# Patient Record
Sex: Male | Born: 1980 | Race: Black or African American | Hispanic: No | Marital: Single | Smoking: Current every day smoker
Health system: Southern US, Community
[De-identification: ages and names within clinical notes are randomized; demographics above are authoritative.]

## PROBLEM LIST (undated history)

## (undated) DIAGNOSIS — I10 Essential (primary) hypertension: Secondary | ICD-10-CM

## (undated) DIAGNOSIS — F319 Bipolar disorder, unspecified: Secondary | ICD-10-CM

## (undated) DIAGNOSIS — F209 Schizophrenia, unspecified: Secondary | ICD-10-CM

## (undated) HISTORY — PX: COLONOSCOPY: SHX174

---
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
## 2001-11-05 ENCOUNTER — Emergency Department (HOSPITAL_COMMUNITY): Admission: EM | Admit: 2001-11-05 | Discharge: 2001-11-06 | Payer: Self-pay | Admitting: *Deleted

## 2002-10-16 ENCOUNTER — Emergency Department (HOSPITAL_COMMUNITY): Admission: EM | Admit: 2002-10-16 | Discharge: 2002-10-16 | Payer: Self-pay | Admitting: Emergency Medicine

## 2003-07-13 ENCOUNTER — Emergency Department (HOSPITAL_COMMUNITY): Admission: EM | Admit: 2003-07-13 | Discharge: 2003-07-13 | Payer: Self-pay | Admitting: Emergency Medicine

## 2003-07-22 ENCOUNTER — Ambulatory Visit (HOSPITAL_COMMUNITY): Admission: RE | Admit: 2003-07-22 | Discharge: 2003-07-22 | Payer: Self-pay | Admitting: Family Medicine

## 2005-08-18 ENCOUNTER — Emergency Department (HOSPITAL_COMMUNITY): Admission: EM | Admit: 2005-08-18 | Discharge: 2005-08-18 | Payer: Self-pay | Admitting: Family Medicine

## 2005-09-11 ENCOUNTER — Inpatient Hospital Stay (HOSPITAL_COMMUNITY): Admission: EM | Admit: 2005-09-11 | Discharge: 2005-09-15 | Payer: Self-pay | Admitting: Emergency Medicine

## 2005-09-15 ENCOUNTER — Inpatient Hospital Stay (HOSPITAL_COMMUNITY): Admission: AD | Admit: 2005-09-15 | Discharge: 2005-09-17 | Payer: Self-pay | Admitting: Psychiatry

## 2005-09-15 ENCOUNTER — Ambulatory Visit: Payer: Self-pay | Admitting: Psychiatry

## 2006-09-27 ENCOUNTER — Emergency Department: Payer: Self-pay | Admitting: Emergency Medicine

## 2007-05-01 ENCOUNTER — Emergency Department (HOSPITAL_COMMUNITY): Admission: EM | Admit: 2007-05-01 | Discharge: 2007-05-01 | Payer: Self-pay | Admitting: Emergency Medicine

## 2007-10-02 ENCOUNTER — Emergency Department (HOSPITAL_COMMUNITY): Admission: EM | Admit: 2007-10-02 | Discharge: 2007-10-02 | Payer: Self-pay | Admitting: Emergency Medicine

## 2007-10-03 ENCOUNTER — Inpatient Hospital Stay (HOSPITAL_COMMUNITY): Admission: RE | Admit: 2007-10-03 | Discharge: 2007-10-05 | Payer: Self-pay | Admitting: Psychiatry

## 2007-10-05 ENCOUNTER — Ambulatory Visit: Payer: Self-pay | Admitting: Psychiatry

## 2007-11-16 ENCOUNTER — Ambulatory Visit (HOSPITAL_COMMUNITY): Payer: Self-pay | Admitting: Psychology

## 2008-06-13 ENCOUNTER — Emergency Department (HOSPITAL_COMMUNITY): Admission: EM | Admit: 2008-06-13 | Discharge: 2008-06-13 | Payer: Self-pay | Admitting: Emergency Medicine

## 2008-06-14 ENCOUNTER — Emergency Department (HOSPITAL_COMMUNITY): Admission: EM | Admit: 2008-06-14 | Discharge: 2008-06-14 | Payer: Self-pay | Admitting: Emergency Medicine

## 2008-06-15 ENCOUNTER — Inpatient Hospital Stay (HOSPITAL_COMMUNITY): Admission: RE | Admit: 2008-06-15 | Discharge: 2008-06-20 | Payer: Self-pay | Admitting: Psychiatry

## 2008-06-15 ENCOUNTER — Ambulatory Visit: Payer: Self-pay | Admitting: Psychiatry

## 2009-01-06 ENCOUNTER — Inpatient Hospital Stay (HOSPITAL_COMMUNITY): Admission: EM | Admit: 2009-01-06 | Discharge: 2009-01-09 | Payer: Self-pay | Admitting: Psychiatry

## 2009-01-06 ENCOUNTER — Ambulatory Visit: Payer: Self-pay | Admitting: Psychiatry

## 2009-01-06 ENCOUNTER — Emergency Department (HOSPITAL_COMMUNITY): Admission: EM | Admit: 2009-01-06 | Discharge: 2009-01-06 | Payer: Self-pay | Admitting: Emergency Medicine

## 2009-08-17 ENCOUNTER — Emergency Department (HOSPITAL_COMMUNITY): Admission: EM | Admit: 2009-08-17 | Discharge: 2009-08-18 | Payer: Self-pay | Admitting: Emergency Medicine

## 2010-01-06 ENCOUNTER — Emergency Department (HOSPITAL_COMMUNITY)
Admission: EM | Admit: 2010-01-06 | Discharge: 2010-01-06 | Payer: Self-pay | Source: Home / Self Care | Admitting: Emergency Medicine

## 2010-03-26 LAB — DIFFERENTIAL
Basophils Relative: 1 % (ref 0–1)
Eosinophils Absolute: 0.1 10*3/uL (ref 0.0–0.7)
Eosinophils Relative: 1 % (ref 0–5)
Lymphs Abs: 2.6 10*3/uL (ref 0.7–4.0)
Monocytes Absolute: 0.7 10*3/uL (ref 0.1–1.0)
Monocytes Relative: 8 % (ref 3–12)

## 2010-03-26 LAB — BASIC METABOLIC PANEL
CO2: 26 mEq/L (ref 19–32)
Calcium: 8.4 mg/dL (ref 8.4–10.5)
Chloride: 108 mEq/L (ref 96–112)
GFR calc Af Amer: >60 mL/min (ref >60)
Glucose, Bld: 119 mg/dL — ABNORMAL HIGH (ref 70–99)
Potassium: 3.8 mEq/L (ref 3.5–5.1)
Sodium: 143 mEq/L (ref 135–145)

## 2010-03-26 LAB — CBC
HCT: 50.7 % (ref 39.0–52.0)
Hemoglobin: 16.9 g/dL (ref 13.0–17.0)
MCH: 27.3 pg (ref 26.0–34.0)
MCHC: 33.3 g/dL (ref 30.0–36.0)
MCV: 82 fL (ref 78.0–100.0)
RBC: 6.18 MIL/uL — ABNORMAL HIGH (ref 4.22–5.81)

## 2010-03-26 LAB — URINALYSIS, ROUTINE W REFLEX MICROSCOPIC
Bilirubin Urine: NEGATIVE
Glucose, UA: NEGATIVE mg/dL
Protein, ur: NEGATIVE mg/dL
Urobilinogen, UA: 0.2 mg/dL (ref 0.0–1.0)

## 2010-03-26 LAB — RAPID URINE DRUG SCREEN, HOSP PERFORMED
Benzodiazepines: NOT DETECTED
Cocaine: POSITIVE — AB
Opiates: POSITIVE — AB
Tetrahydrocannabinol: NOT DETECTED

## 2010-03-26 LAB — URINE MICROSCOPIC-ADD ON

## 2010-03-30 LAB — CBC
Hemoglobin: 15.8 g/dL (ref 13.0–17.0)
Platelets: 237 10*3/uL (ref 150–400)
RBC: 5.83 MIL/uL — ABNORMAL HIGH (ref 4.22–5.81)
WBC: 14.1 10*3/uL — ABNORMAL HIGH (ref 4.0–10.5)

## 2010-03-30 LAB — ETHANOL: Alcohol, Ethyl (B): <5 mg/dL (ref 0–10)

## 2010-03-30 LAB — DIFFERENTIAL
Lymphocytes Relative: 13 % (ref 12–46)
Lymphs Abs: 1.8 10*3/uL (ref 0.7–4.0)
Monocytes Relative: 6 % (ref 3–12)
Neutro Abs: 11.2 10*3/uL — ABNORMAL HIGH (ref 1.7–7.7)
Neutrophils Relative %: 80 % — ABNORMAL HIGH (ref 43–77)

## 2010-03-30 LAB — RAPID URINE DRUG SCREEN, HOSP PERFORMED
Amphetamines: NOT DETECTED
Barbiturates: NOT DETECTED
Benzodiazepines: NOT DETECTED

## 2010-03-30 LAB — BASIC METABOLIC PANEL
CO2: 26 mEq/L (ref 19–32)
Calcium: 9.8 mg/dL (ref 8.4–10.5)
Creatinine, Ser: 0.93 mg/dL (ref 0.4–1.5)
GFR calc Af Amer: >60 mL/min (ref >60)
Sodium: 140 mEq/L (ref 135–145)

## 2010-04-16 LAB — HEPATIC FUNCTION PANEL
ALT: 17 U/L (ref 0–53)
AST: 20 U/L (ref 0–37)
Albumin: 3.7 g/dL (ref 3.5–5.2)
Alkaline Phosphatase: 73 U/L (ref 39–117)
Total Bilirubin: 0.8 mg/dL (ref 0.3–1.2)
Total Protein: 6.4 g/dL (ref 6.0–8.3)

## 2010-04-16 LAB — BASIC METABOLIC PANEL
Calcium: 9.2 mg/dL (ref 8.4–10.5)
GFR calc non Af Amer: >60 mL/min (ref >60)
Glucose, Bld: 129 mg/dL — ABNORMAL HIGH (ref 70–99)
Sodium: 141 mEq/L (ref 135–145)

## 2010-04-16 LAB — RAPID URINE DRUG SCREEN, HOSP PERFORMED
Amphetamines: NOT DETECTED
Opiates: NOT DETECTED
Tetrahydrocannabinol: POSITIVE — AB

## 2010-04-16 LAB — LIPID PANEL
Cholesterol: 175 mg/dL (ref 0–200)
HDL: 51 mg/dL (ref >39)
LDL Cholesterol: 108 mg/dL — ABNORMAL HIGH (ref 0–99)
Triglycerides: 79 mg/dL (ref <150)

## 2010-04-16 LAB — ETHANOL: Alcohol, Ethyl (B): <5 mg/dL (ref 0–10)

## 2010-04-16 LAB — HEMOGLOBIN A1C: Mean Plasma Glucose: 120 mg/dL

## 2010-04-16 LAB — CBC
Hemoglobin: 15.2 g/dL (ref 13.0–17.0)
Platelets: 278 10*3/uL (ref 150–400)
RDW: 14.9 % (ref 11.5–15.5)
WBC: 12 10*3/uL — ABNORMAL HIGH (ref 4.0–10.5)

## 2010-04-16 LAB — RPR: RPR Ser Ql: NONREACTIVE

## 2010-04-23 LAB — POCT I-STAT, CHEM 8
BUN: 9 mg/dL (ref 6–23)
Creatinine, Ser: 1.2 mg/dL (ref 0.4–1.5)
Glucose, Bld: 104 mg/dL — ABNORMAL HIGH (ref 70–99)
Sodium: 142 mEq/L (ref 135–145)
TCO2: 26 mmol/L (ref 0–100)

## 2010-04-23 LAB — COMPREHENSIVE METABOLIC PANEL
ALT: 13 U/L (ref 0–53)
AST: 22 U/L (ref 0–37)
Albumin: 4.4 g/dL (ref 3.5–5.2)
Alkaline Phosphatase: 87 U/L (ref 39–117)
Chloride: 107 mEq/L (ref 96–112)
GFR calc Af Amer: >60 mL/min (ref >60)
Potassium: 3.3 mEq/L — ABNORMAL LOW (ref 3.5–5.1)
Sodium: 143 mEq/L (ref 135–145)
Total Protein: 7.4 g/dL (ref 6.0–8.3)

## 2010-04-23 LAB — VITAMIN B12: Vitamin B-12: 634 pg/mL (ref 211–911)

## 2010-04-23 LAB — URINALYSIS, ROUTINE W REFLEX MICROSCOPIC
Hgb urine dipstick: NEGATIVE
Ketones, ur: 15 mg/dL — AB
Nitrite: NEGATIVE
Nitrite: NEGATIVE
Protein, ur: 30 mg/dL — AB
Specific Gravity, Urine: 1.023 (ref 1.005–1.030)
Urobilinogen, UA: 1 mg/dL (ref 0.0–1.0)
Urobilinogen, UA: 2 mg/dL — ABNORMAL HIGH (ref 0.0–1.0)

## 2010-04-23 LAB — DIFFERENTIAL
Basophils Relative: 0 % (ref 0–1)
Eosinophils Absolute: 0.2 10*3/uL (ref 0.0–0.7)
Eosinophils Relative: 2 % (ref 0–5)
Monocytes Absolute: 1.1 10*3/uL — ABNORMAL HIGH (ref 0.1–1.0)
Monocytes Relative: 10 % (ref 3–12)
Neutro Abs: 7.1 10*3/uL (ref 1.7–7.7)

## 2010-04-23 LAB — CBC
Platelets: 239 10*3/uL (ref 150–400)
RDW: 15 % (ref 11.5–15.5)
WBC: 10.8 10*3/uL — ABNORMAL HIGH (ref 4.0–10.5)

## 2010-04-23 LAB — ACETAMINOPHEN LEVEL: Acetaminophen (Tylenol), Serum: <10 ug/mL — ABNORMAL LOW (ref 10–30)

## 2010-04-23 LAB — RAPID URINE DRUG SCREEN, HOSP PERFORMED
Amphetamines: NOT DETECTED
Benzodiazepines: NOT DETECTED

## 2010-04-23 LAB — URINE MICROSCOPIC-ADD ON

## 2010-04-23 LAB — ETHANOL: Alcohol, Ethyl (B): <5 mg/dL (ref 0–10)

## 2010-04-24 LAB — URINALYSIS, ROUTINE W REFLEX MICROSCOPIC
Bilirubin Urine: NEGATIVE
Glucose, UA: NEGATIVE mg/dL
Protein, ur: NEGATIVE mg/dL

## 2010-04-24 LAB — URINE MICROSCOPIC-ADD ON

## 2010-04-24 LAB — POCT I-STAT, CHEM 8
BUN: 5 mg/dL — ABNORMAL LOW (ref 6–23)
Calcium, Ion: 1.14 mmol/L (ref 1.12–1.32)
Chloride: 104 mEq/L (ref 96–112)
Glucose, Bld: 130 mg/dL — ABNORMAL HIGH (ref 70–99)

## 2010-04-24 LAB — RAPID URINE DRUG SCREEN, HOSP PERFORMED
Barbiturates: NOT DETECTED
Opiates: NOT DETECTED

## 2010-05-29 NOTE — H&P (Signed)
[name], [name]              ACCOUNT NO.:  [id number]   MEDICAL RECORD NO.:  [id number]          PATIENT TYPE:  INP   LOCATION:  0304                          FACILITY:  [facility]   PHYSICIAN:  Anselm Jungling, MD  DATE OF BIRTH:  1980-03-22   DATE OF ADMISSION:  10/03/2007  DATE OF DISCHARGE:                       PSYCHIATRIC ADMISSION ASSESSMENT   This is an involuntary admission to the services of Dr. Geralyn Flash.   IDENTIFYING INFORMATION:  This is a 30 year old single [place] American  male.  Apparently, he became noncompliant with his meds approximately  two months ago.  He presented to the emergency department.  He had not  slept in the past two days.  His mood was unstable.  He had a panic  attack at his work.  In the ED, he exhibited poor impulse control by  pulling out his IV.  He was agitated.  He was felt to represent a danger  to himself and/or others, hence, he was comitted.  Today, he reports  that he got tired of having to take meds.  He initially started weaning  himself off the meds and eventually stopped.  He had been seeing Dr. Damita Dunnings.  He states that he missed one appointment and has not been able  to get back into care.   PAST PSYCHIATRIC HISTORY:  [name] had his first episode at age 76,  and he was with [place] in 2007.  He was with [place] from September 2-4, 2007.   SOCIAL HISTORY:  Basically the same.  He is a high school graduate in  2002.  He has never married.  He has no children.  He is still employed  at [employer].  He began his employment with them in February,  2006.  He has not started college.  When he was last with [place], he was  hoping to have started college.   FAMILY HISTORY:  He has a maternal aunt who was thought to have a mood  disorder and a maternal uncle with schizophrenia.   ALCOHOL/DRUG HISTORY:  He has been using marijuana and alcohol since age  60 as well as occasional Vicodin and Xanax.  His UDS was negative, and  he had  no alcohol on board yesterday.   PRIMARY CARE Leighana Neyman:  None.   He was seeing Dr. Damita Dunnings for psychiatry followup, but I do not  believe he is still a patient.   MEDICAL PROBLEMS:  He is noted to have hypertension.   MEDICATIONS:  He stopped his medications about 2-1/2 months ago, but he  was last prescribed Abilify 2.5 mg b.i.d. and Depakote ER 1000 mg  nightly, and these meds were restarted.   DRUG ALLERGIES:  He has no known drug allergies.   POSITIVE PHYSICAL FINDINGS:  He was medically cleared in the ED at the  hospital.  As already stated, his UDS was negative.  His alcohol level  was less than 5.  His vital signs on admission to our unit show he is 69 inches tall.  Weight is 145.  Temperature was 97.3.  Blood pressure was  136/91 to  134/89.  Pulse ranged 84-110.  Respirations are 16.   He does not have any positives on his review of systems today.   MENTAL STATUS EXAM:  He is alert but drowsy.  He is appropriately  groomed, dressed, and nourished.  His speech is not pressured.  His mood  is calm.  He reports that he feels kind of woozy from having had his  meds restarted.  His thought processes are clear, rational, and goal-  oriented.  He understands now that he probably needs to remain on at  least one agent once he gets restabilized.  Judgment and insight are  good.  Concentration and memory superficially are intact.  Intelligence  is at least average.  He denies being suicidal or suicidal.  He denies  auditory or visual hallucinations.   DIAGNOSES:   AXIS I:  Bipolar.  Most recent episode manic due to noncompliance.   AXIS II:  Deferred.   AXIS III:  Hypertension.   AXIS IV:  No serious stressors at the moment.   AXIS V:  35.   PLAN:  Admit for safety and stabilization. We will restart his meds.  We  will identify a psychiatrist who was in his network, [facility].   ESTIMATED LENGTH OF STAY:  3-5 days.  We will check his Depakote level  on  September 21.      Mickie Leonarda Salon, P.A.-C.      Anselm Jungling, MD  Electronically Signed    MD/MEDQ  D:  10/03/2007  T:  10/03/2007  Job:  [id number]

## 2010-05-29 NOTE — H&P (Signed)
[name], [name]              ACCOUNT NO.:  [id number]   MEDICAL RECORD NO.:  [id number]          PATIENT TYPE:  IPS   LOCATION:  0502                          FACILITY:  [facility]   PHYSICIAN:  Landry Corporal, N.P.    DATE OF BIRTH:  Feb 18, 1980   DATE OF ADMISSION:  06/15/2008  DATE OF DISCHARGE:                       PSYCHIATRIC ADMISSION ASSESSMENT   PATIENT IDENTIFICATION:  This is a 30 year old male who was voluntarily  admitted on June 14, 2008.   HISTORY OF PRESENT ILLNESS:  The patient initially presented to the  emergency room with complaints of heat exhaustion and being off his  medications for some time.  Presented with his mother who also noted  some behavioral changes with no further details.  The patient today  offers no significant stressors in his life.   PAST PSYCHIATRIC HISTORY:  The patient has been here prior, that was in  September 2009 for bipolar disorder and panic attacks was discharged on  Abilify and stab sore.   SOCIAL HISTORY:  This is a single male who lives in [place].  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   FAMILY HISTORY:  None known.   ALCOHOL AND DRUG HABITS:  The patient uses beer on occasion.  Urine drug  screen is positive for cannabis.   PRIMARY CARE Tevin Shillingford:  None.   MEDICAL PROBLEMS:  He denies any acute or chronic health issues.   MEDICATIONS:  Listed is Benadryl, and the patient had taken some Symbyax  at home.   DRUG ALLERGIES:  No known allergies.   PHYSICAL EXAMINATION:  This is a well-nourished, well-developed young  male who was fully assessed at [facility] emergency department.  Physical exam was reviewed with no significant findings.  His vital  signs 98.7, 74 heart rate, 16 respirations, blood pressure is 155/83.  His laboratory data shows a hemoglobin of 18 with hematocrit of 53,  blood sugar 104, Depakote level was less than 10.  Alcohol level less  than 5, lithium  level 0.25.  CBC shows WBC count 10.8.  Urine drug  screen positive for THC.  Urinalysis shows 3-6 WBCs and 3-6 RBCs.   MENTAL STATUS EXAM:  The patient is sitting on his bed eating lunch at  this time, fully alert, good eye contact.  He seems somewhat reserved.  His speech although clear, seems to have some mild thought blocking.  He  offers little information. His mood is neutral, talks briefly about the  heat exhaustion but offers no other significant details or  circumstances.  He does deny any suicidal or homicidal thoughts or  hallucinations, cognitive function intact.  He is a poor historian.   DIAGNOSES:  AXIS I:  Mood disorder.  THC abuse.  AXIS II:  Deferred.  AXIS III:  No known medical conditions.  AXIS IV:  Deferred at this time.  AXIS V:  Current is 35.   PLAN:  Plan is to gather more history.  Get permission to contact mom  for further background.  Will offer Zyprexa 10 mg at bedtime.  Consider  family session with his mother.  The case manager will obtain his follow-  up, reinforce medication compliance and address his cannabis abuse.  Tentative length of stay at this time is 3-5 days.      Landry Corporal, N.P.     JO/MEDQ  D:  06/15/2008  T:  06/15/2008  Job:  [id number]

## 2010-06-01 NOTE — Group Therapy Note (Signed)
[name], [name]              ACCOUNT NO.:  [id number]   MEDICAL RECORD NO.:  [id number]          PATIENT TYPE:  INP   LOCATION:  IC04                          [facility]   PHYSICIAN:  Margaretmary Dys, M.D.DATE OF BIRTH:  24-Oct-1980   DATE OF PROCEDURE:  09/13/2005  DATE OF DISCHARGE:                                   PROGRESS NOTE   SUBJECTIVE:  The patient remains intubated.  He is on propofol.  He is not  quite responsive obviously for this reason.  Apparently, the patient may  have overdosed on his Depakote as his levels were in the toxic range.   OBJECTIVE:  GENERAL:  The patient is sedated and remains on mechanical  ventilation.  VITAL SIGNS:  Blood pressure 120/70, pulse 82, respirations 16, T-max 98.8  degrees Fahrenheit.  HEENT:  Normocephalic.  The patient has an endotracheal tube in place.  NECK:  Supple.  LUNGS:  Clear with good air entry bilaterally.  HEART:  S1, S2 regular.  ABDOMEN:  Soft, nontender, bowel sounds positive.  EXTREMITIES:  No edema.  NEUROLOGIC:  The patient is very sedated.   LABORATORY DATA AND X-RAY FINDINGS:  Blood gas on mechanical ventilation was  pH 7.511, pCO2 33.7, pO2 167, bicarb 26.8.  WBC 6.7, hemoglobin 13,  hematocrit 39.4, platelet count 188, with no left shift.  Sodium 142,  potassium 3.2, chloride 107, CO2 27, glucose 86, BUN 5, creatinine 0.9.  AST  30, ALT 22.  Valproic acid level is down to 168 today.   ASSESSMENT:  Drug overdose with likely Depakote.  Drug levels in the toxic  range and getting better.   PLAN:  Will proceed towards trying to wean him off the ventilator.  I will  change his ventilation mode to SIMV.  Will proceed with protocol per  respiratory therapy.  Will also do an awakening trial on him this morning.  Continue to hold all his other medications at this time.      Margaretmary Dys, M.D.  Electronically Signed     AM/MEDQ  D:  09/13/2005  T:  09/13/2005  Job:  [id number]

## 2010-06-01 NOTE — Discharge Summary (Signed)
[name], [name]              ACCOUNT NO.:  [id number]   MEDICAL RECORD NO.:  [id number]          PATIENT TYPE:  IPS   LOCATION:  0502                          FACILITY:  [facility]   PHYSICIAN:  Geoffery Lyons, M.D.      DATE OF BIRTH:  Feb 05, 1980   DATE OF ADMISSION:  06/15/2008  DATE OF DISCHARGE:  06/20/2008                               DISCHARGE SUMMARY   CHIEF COMPLAINT AND PRESENT ILLNESS:  This was second admission to [facility] for this 30 year old male who was voluntarily  admitted.  He presented to the emergency room with complaints of heat  exhaustion and being off his medications for some time.  Presented with  his mother who also noted under some behavioral changes but no further  details.  On assessment, initially he presented with some  suspiciousness, afraid to answer questions.  Endorsed that he had been  diagnosed with bipolar, has been off his medication for 3 months.  Does  not feel himself.  Wants to get back on his medications.  Poor appetite,  sleeping only a few hours.   PAST PSYCHIATRIC HISTORY:  Last admission September 2009, diagnosed  bipolar and had panic attacks.  He was discharged on Abilify.   ALCOHOL AND DRUG HISTORY:  UDS positive for marijuana, uses  occasionally.   MEDICAL HISTORY:  Noncontributory.   MEDICATIONS:  Endorsed he had discontinued his medication, had taken  some Symbyax in the past as well as Abilify.   PHYSICAL EXAMINATION:  Failed to show any acute findings.   LABORATORY WORK:  Hemoglobin 18, hematocrit 53.  Alcohol level less than  5.  CBC:  White blood cells 10.8.  UDS positive for marijuana.   MENTAL STATUS EXAMINATION:  Reveals a male initially sitting in his bed,  alert, good eye contact, somewhat reserved and guarded, some thought  blocking, offers little information.  Mood is down, talks about heat  exhaustion.  Denied any active suicidal or homicidal ideations.  Cognition well-preserved.   ADMISSION  DIAGNOSES:  AXIS I:  Bipolar disorder.  Marijuana abuse.  AXIS II:  No diagnosis.  AXIS III:  No diagnosis.  AXIS IV:  Moderate.  AXIS V:  On admission 35, highest GAF in last year 50-55.   COURSE IN THE HOSPITAL:  Was admitted, started in individual and group  psychotherapy.  Was initially placed on Depakote and Symbyax.  Later, he  was placed on Zyprexa.  As already stated, diagnosed with bipolar  disorder.  Was on Zyprexa, Symbyax.  Ran out of  Zyprexa and started  taking something else.  Had been on Lamictal and Depakote.  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  Some issues about  people around him.  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  On June  3, he would rather not use the Depakote due to the gastrointestinal  symptoms.  Would rather just do the Zyprexa but very limited insight.  He was going to eventually go back home.  Living with his mother for 8  years, but he  continued to be quite reserved, guarded, not engaging,  rather constricted affect.  June 4, he was sedated that morning but did  sleep better, comfortable with the Zyprexa, and it has worked for him  before.  He had had a little broader affect.  In the next 48 hours, he  continued to improve.  Mood feeling better.  Affect was brighter, more  interactive.  On June 7, he was in full contact with reality.  No  suicidal or homicidal ideas.  Willing and motivated to continue  treatment.  Marked improvement from admission.  Was going to take some  days off before going back to work.   DISCHARGE DIAGNOSES:  AXIS I:  Bipolar disorder.  AXIS II:  No diagnosis.  AXIS III:  No diagnosis.  AXIS IV:  Moderate.  AXIS V:  On discharge 55-60.   DISCHARGED ON:  Zyprexa 5 mg 3 at bedtime.   FOLLOW-UP:  [facility], [place].      Geoffery Lyons, M.D.  Electronically Signed     IL/MEDQ  D:  07/21/2008  T:  07/21/2008  Job:  [id number]

## 2010-06-01 NOTE — H&P (Signed)
[name], [name]              ACCOUNT NO.:  [id number]   MEDICAL RECORD NO.:  [id number]          PATIENT TYPE:  IPS   LOCATION:  0501                          [facility]ACILITY[facility]  [facility]   PHYSICIAN:  Anselm Jungling, MD  DATE OF BIRTH:  [birth year]   DATE OF ADMISSION:  09/15/2005  DATE OF DISCHARGE:                         PSYCHIATRIC ADMISSION ASSESSMENT   This is an involuntary admission to the services of Dr. Lorna Dibble.   Apparently on August 29th, the patient overdosed on some medications that he  had at home.  Earlier in the week, he said that he had called [facility] to  get his medications renewed.  When he returned from visiting an aunt in  [place] on Thursday, the medications had not been renewed.  This made him  have to stay out of work on Thursday and Friday of that week, and he had to  get another appointment to see Dr. Omelia Blackwater, and apparently Dr. Omelia Blackwater wrote  him some prescriptions and also gave him some samples.  This was two days  prior to the 29th, so that would have been on the 27th.  Apparently, he  claims that he had also concomitantly, about three weeks ago now, suddenly  stopped using marijuana and alcohol.  He asked his mother and brother to get  him something to eat, and after they left, he overdosed.  They returned.  They realized that he had an altered mental status.  They brought him to the  emergency room at [facility] on the 29th, and he became more  obtunded in the emergency room, and they intubated.  He was extubated on the  31st, and he was found to be medically stable and has now been transferred  here to the [facility].  The patient was originally diagnosed  in 2003 at [facility] in [place] with bipolar disorder.  Apparently at  this time, he was charged with trespassing, using marijuana, and drinking  alcohol under the age of 2.  He was so worried about these charges that  this caused him to become very nervous.  He lost  sleep over it, etc., and  this precipitated some event, I am not exactly sure what, that had him be  admitted to [facility].  That is where he got the diagnosis of bipolar.  He has been being seen on an outpatient basis by Dr. Damita Dunnings in  [place].  He also states that he saw a therapist, Alden Server, in the past,  and liked Alden Server, but Alden Server is no longer practicing in [place].   SOCIAL HISTORY:  He is a high Garment/textile technologist in 2002.  He has never  married.  He has no children.  He has been employed at [employer] for  about 18 months now, February, 2006, and he drives a Chief Executive Officer.  He is hoping  to start college.   FAMILY HISTORY:  A maternal aunt is thought to have had a mood disorder.  A  maternal uncle with schizophrenia.   ALCOHOL/DRUG HISTORY:  He has been using marijuana and alcohol since age 40  as well as occasional Vicodin and Xanax.   PRIMARY CARE Adyson Vanburen:  He is trying to get a new one.   MEDICAL PROBLEMS:  He is supposed to be having his blood pressure evaluated.  Apparently, it has been elevated in the past.   MEDICATIONS:  He does not exactly remember his new medications, but he has  taken Symbyax in the past, 12/25.  I will have to call [facility] in  [place] to verify his meds.   DRUG ALLERGIES:  No known drug allergies.   PHYSICAL EXAMINATION:  VITAL SIGNS:  His vital signs on transfer show that  he is 67-1/2 inches tall.  He weighs 200 pounds.  Temperature 98.6, blood  pressure 182/119 to 186/76, pulse ranges from 102 to 140, respirations 18.  GENERAL:  He is a well-developed, healthy young man who appears his stated  age.  He has no other remarkable findings.  MENTAL STATUS:  Today, he is alert and oriented x3.  He is appropriately  groomed, dressed, and nourished.  His speech is not pressured.  He has  normal rate, rhythm, and tone.  His mood is good.  His affect is within  normal range.  His thought processes are clear, goal oriented.  He wants  to  go to school.  Judgment and insight are fair.  Concentration and memory are  intact.  Intelligence is at least average.  He denies suicidal or homicidal  ideation.  He denies auditory or visual hallucinations.  He states that  since being clean and sober these past weeks, he feels calmer, and his  thoughts are clearer.  He states that now he wants to read.   DIAGNOSES:   AXIS I:  1. Bipolar, status post significant overdose.  2. Substance abuse of alcohol and marijuana, early sustained remission of      three weeks.   AXIS II:  Deferred.   AXIS III:  Hypertension.   AXIS IV:  Moderate.   AXIS V:  39.   The plan is to admit for safety and stabilization.  We will adjust his  medications in the morning.  I will let Dr. Electa Sniff do that.  He might need  some testing.  I would suggest going to [facility] Department of Psychology.  He  would like to go to school, etc., but he is not sure what would be helpful  to him.      Mickie Leonarda Salon, P.A.-C.      Anselm Jungling, MD  Electronically Signed    MD/MEDQ  D:  09/16/2005  T:  09/16/2005  Job:  [phone]

## 2010-06-01 NOTE — Discharge Summary (Signed)
[name], [name]              ACCOUNT NO.:  [id number]   MEDICAL RECORD NO.:  [id number]           PATIENT TYPE:   LOCATION:  A201                          [facility]   PHYSICIAN:  Margaretmary Dys, M.D.DATE OF BIRTH:  02/25/1980   DATE OF ADMISSION:  09/11/2005  DATE OF DISCHARGE:  09/02/2007LH                                 DISCHARGE SUMMARY   TRANSFER SUMMARY   DISCHARGE DIAGNOSES:  1. Suicide attempt with drug overdose.  The patient took several pills of      Depakote.  2. Severe depression.  3. History of seizure disorders.   The patient is a 30 year old, African-American male with a history of  bipolar disorder, who stopped taking some of his medications recently.  The  patient apparently was in to see his psychiatrist two days prior to  admission and received samples and prescriptions.  He was brought into the  emergency room with altered mental status.  Family review, history and  physical from September 11, 2005.  In the emergency room, the patient was noted  to become even more obtunded, and it was decided to intubate him for airway  protection.  Apparently the patient took some medications in an attempt to  kill himself.  The patient took an unspecified amount of Depakote as well as  Strattera and Symbyax.  The patient was quite obtunded on physical exam and  was intubated at the time he was admitted to ICU.  His laboratory data  really showed a Depakote level breaking at 300, which is in the severely  toxic range.  His EKG showed sinus tachycardia with a prolonged QT interval.   The patient, however, did fairly well in the intensive care unit with no  major concerns.  He was agitated when he had his initial awakening trial,  and he had to be controlled with Propofol.  The patient, however, did fairly  well overall and was extubated successfully and his Depakote level steadily  decreased and it is currently 90 today, which is in the therapeutic range.  His  EKG's QT interval also narrowed and became normal today.  The patient is  now being transferred to a psychiatric hospital for further evaluation and  management.      Margaretmary Dys, M.D.  Electronically Signed     AM/MEDQ  D:  09/15/2005  T:  09/15/2005  Job:  [id number]

## 2010-06-01 NOTE — H&P (Signed)
NAMEKINLEY, FERRENTINO              ACCOUNT NO.:  [id number]   MEDICAL RECORD NO.:  [id number]          PATIENT TYPE:  INP   LOCATION:  IC04                          FACILITY:  [facility]   PHYSICIAN:  Lonia Blood, M.D.       DATE OF BIRTH:  30-Jun-1980   DATE OF ADMISSION:  09/11/2005  DATE OF DISCHARGE:  LH                                HISTORY & PHYSICAL   PRIMARY CARE PHYSICIAN:  He is unassigned medically.  He sees a  psychiatrist.   CHIEF COMPLAINT:  Altered mental status.   HISTORY OF PRESENT ILLNESS:  Mr. Breck is a 30 year old African-American  gentleman with a history of bipolar disorder who stopped taking his  medications recently. He then went to see his psychiatrist 2 days prior to  admission and received samples and prescriptions. He was brought in by his  family today for altered mental status.  Apparently, he took more of his  medications than prescribed in an attempt to kill himself.  After arrival to  the emergency room, he became more obtunded and the patient emergency room  physician decided to intubate for airway protection.  I was called to admit  this patient who is currently on the ventilator.   History that I could gather is that the patient has been more agitated for  the past two to three days, writing and talking constantly, unable to sleep  at night.  Today, apparently he took an unspecified amount of Depakote as  well as some Strattera and Symbyax.  The rest of the history is  unobtainable.   PAST MEDICAL HISTORY:  Bipolar disorder.   HOME MEDICATIONS:  1. Depakote 1500 mg daily.  2. Symbyax daily.  3. Strattera 1 tablet daily.   SOCIAL HISTORY:  The patient smokes a pack of cigarettes a day.  He  occasionally drinks alcohol but he is not a heavy alcohol abuser.  He lives  with his mother.   FAMILY HISTORY:  Unobtainable.   REVIEW OF SYSTEMS:  Unobtainable.   PHYSICAL EXAMINATION:  VITAL SIGNS:  Blood pressure 142/93, pulse 113,  respirations  18, saturation 100% on the ventilator.  GENERAL APPEARANCE:  A well-developed, well-built African-American gentleman  paralyzed on the ventilator, unresponsive.  HEENT:  Head is normocephalic and atraumatic.  Eyes have the pupils in the  midline.  There are about 2 mm, symmetric and reactive to light.  There is  no spontaneous extraocular movement.  NECK:  Supple.  No JVD.  CHEST:  Clear to auscultation bilaterally.  HEART:  Regular rate and rhythm without murmurs, gallops, or rubs.  ABDOMEN:  The patient's abdomen is soft.  Bowel sounds are present.  EXTREMITIES:  Lower extremities have no edema.  SKIN:  Warm and dry without any suspicious rashes.   LABORATORY DATA:  White blood cell count 7.1, hemoglobin 15, platelet count  225,000.  Sodium 139, potassium 3.2, chloride 100, bicarb 25, BUN 10,  creatinine 1.2, calcium 8.8, albumin 4.1, AST 49, ALT 33, alkaline phosphate  91.  Total bilirubin 0.8.  Alcohol level is less than 5.  Acetaminophen  level is less than 10.  A salicylate level is less than 4.  A Depakote level  is more than 300.  EKG shows sinus tachycardia and prolonged QT interval.  A  portable chest x-ray shows the endotracheal tube 6 cm above the carina and  no acute findings.   ASSESSMENT/PLAN:  1. Intentional overdose with suicidal attempt with altered mental status      secondary to ingestion of Depakote and Symbyax:  The patient was      intubated for airway protection in the emergency room.  My plan is to      admit the patient to the intensive care unit, monitor him overnight,      keep him lightly sedated and then attempt to wake him up in the      morning.  I will also  place the patient on intravenous vitamins.  Once      the patient wakes up in the morning, he should be extubated and a      psychiatry consultation should be obtained for transfer to inpatient      psychiatric hospital.  2. Mildly prolonged QT interval most likely secondary to the use of       Strattera and Symbyax:  We will avoid using any QT prolonging      medications and monitor the patient on telemetry.  3. Mild hypokalemia:  We will replace the patient's potassium aggressively      and monitor the patient on telemetry.  4. Deep vein thrombosis prophylaxis will be done using Lovenox.      Gastrointestinal prophylaxis will be done using Protonix.      Lonia Blood, M.D.  Electronically Signed     SL/MEDQ  D:  09/11/2005  T:  09/11/2005  Job:  [id number]

## 2010-06-01 NOTE — Discharge Summary (Signed)
[name], [name]              ACCOUNT NO.:  [id number]   MEDICAL RECORD NO.:  [id number]          PATIENT TYPE:  IPS   LOCATION:  0304                          [facility]   PHYSICIAN:  Geoffery Lyons, M.D.      DATE OF BIRTH:  [birth year]   DATE OF ADMISSION:  10/03/2007  DATE OF DISCHARGE:  10/05/2007                               DISCHARGE SUMMARY   CHIEF COMPLAINT:  This was the second admission to [facility] for this 30 year old African-American male male.  Apparently he  became noncompliant with his medications two months prior to his  admission and presented to the ED.  Had not slept for the last 2 days.  He was pretty unstable and had a panic attack at work.  In the ED he  exhibited poor impulse control by pulling out his IV. He was agitated.  He was felt to represent a danger to himself. Upon this assessment he  endorsed that he got tired of having to take medications, initially  started weaning himself off the medicine and eventually stopped.  Had  been seeing Dr. Redmond Baseman. He  missed one appointment and has not been able  to get back into care.   PAST SOCIAL HISTORY:  First episode at age 36, and he was at [facility] in 2007.  History of using marijuana and alcohol since  age 40 as well as occasional Vicodin and Xanax.   PAST MEDICAL HISTORY:  Hypertension.   MEDICATIONS:  He stopped his meds 2-1/2 months ago.  He was on Abilify  2.5 twice a day and Depakote ER 1000 at night.   Exam failed to show any acute findings.   Laboratory workup not available in the chart.   MENTAL STATUS EXAM:  An alert cooperative male, somewhat drowsy,  appropriately groomed and dressed.  He was not spontaneous, mostly  reserved and guarded. Endorsed he felt kind of woozy from having his  medication restarted. His thought processes were clear, rational and  goal oriented, although again not spontaneous or forthcoming with  information. Endorsed  that now he understood he probably needs to stay  on medications. He was denying any active suicidal or homicidal  ideations.  He has no cognitive deficits.   IMPRESSION:  Axis:  I Bipolar disorder.  Axis II: No diagnosis.  Axis III:  Hypertension.  Axis IV: Moderate.  Axis V:  On admission 35, highest GAF in the last year 60.   COURSE IN THE HOSPITAL:  He was admitted, and he was placed back on  Abilify and Depakote.  He was given Ambien for sleep. As already stated  he endorsed that his anxiety has been building up, had a panic attack  with increased heart rate, left work. Admitted that he had been off  medications for that period of time. Past history of some substance  abuse.  September 20 he was refusing his medications, claiming did not  want to be a vegetable. He was denying active suicidal or homicidal  ideations. On September 21 he was in full contact with  reality.  He did  admit that he had some issues at work but endorsed that there was a lot  of stress that was self-imposed.  He would like to have his production  increased, but again he was not coming from administration. He felt that  he should do it.  He was not suicidal.  He was not homicidal.  He did  say he was comfortable with the Abilify and was willing to take this  medication and he was wanting to be on Stavzor, a form of valproic acid  that he felt did not affect his stomach, and he was comfortable taking  that medication. As he was in full contact reality with no active  suicidal or homicidal ideations, willing to pursue treatment, we went  and discharged to outpatient follow-up.   DISCHARGE DIAGNOSES:  Axis I:  Bipolar disorder, panic attacks.  Axis II:  No diagnosis.  Axis III:  Hypertension.  Axis IV:  Moderate.  Axis V:  On discharge 50.   Discharged on Abilify 5 mg 1 in the morning and 1 at night. To resume  his Stavzor  as per Dr. Redmond Baseman. Ambien 10 at bedtime for sleep.  Follow  up with [facility]. Redmond Baseman in  [facility].      Geoffery Lyons, M.D.  Electronically Signed     IL/MEDQ  D:  10/20/2007  T:  10/21/2007  Job:  [id number]

## 2010-06-01 NOTE — Discharge Summary (Signed)
[name], [name]              ACCOUNT NO.:  [id number]   MEDICAL RECORD NO.:  [id number]          PATIENT TYPE:  IPS   LOCATION:  0501                          [facility]ACILITY[facility]  [facility]   PHYSICIAN:  Anselm Jungling, MD  DATE OF BIRTH:  [birth year]   DATE OF ADMISSION:  09/15/2005  DATE OF DISCHARGE:  09/17/2005                                 DISCHARGE SUMMARY   IDENTIFYING DATA AND REASON FOR ADMISSION:  The patient is a 30 year old  single African American male, working as a Estate agent, who requested  discharge after an overdose of Depakote and Symbyax which had been  prescribed for him.  He requested help with alcohol and marijuana  dependence.  He had stopped using both these substances 2-3 weeks prior to  admission.  He reported he had previously gone through detoxification for  substance abuse in 2003.  At the time of his admission he regretted his  overdose, and glad that he survived, but he was also glad that it got him  into a treatment situation in our program.  He denied any further suicidal  ideation.  He was given initial Axis I diagnoses of mood disorder NOS, and  alcohol and marijuana dependence.   MEDICAL AND LABORATORY:  The patient was medically and physically assessed  by the psychiatric nurse practitioner.  He was in good health without any  active or chronic medical problems, except for GERD, which was addressed  with Protonix 40 mg daily.   HOSPITAL COURSE:  The patient was admitted to the adult inpatient  psychiatric service.  He presented as a well-nourished, well-developed, well-  groomed and organized male.  He was fully oriented, pleasant, and  appropriate.  His mood appeared neutral.  His thoughts and speech were  normally organized and there were no signs or symptoms of psychosis.  He was  non tremulous.  He did not appear to be exhibiting any physical signs or  symptoms of alcohol or drug withdrawal.   The patient was not continued on his usual  Depakote and fluoxetine  initially.  This was because he had overdosed on these and there was concern  about excessive levels of these in his system already.   The patient engaged in discussions about his alcohol and marijuana use.  He  agreed that he needs some form of treatment or programming to remain  abstinent of these substances.  We explored the possibility of inpatient  residential or intensive outpatient chemical dependency programming, but  because of the nature of his work hours, it was determined that that would  not be effective.  Instead, the patient indicated that he would like to see  an outpatient substance abuse counselor and attend alcoholics anonymous  meetings.   The patient was discharged on the third hospital day, as it was felt that he  was quite stable, had consistently been absent suicidal ideation.   AFTERCARE:  The patient was to follow-up with Dr. Omelia Blackwater.  The staff had  made numerous attempts to contact Dr. Lilia Pro office by phone,  unsuccessfully.  For this reason, the patient was  advised to go to Dr.  Lilia Pro office directly to set up a follow-up.   DISCHARGE MEDICATIONS:  Protonix 40 mg daily.  The patient had been on  Depakote and Prozac, but it was felt best to leave the decision about  restarting those to Dr. Omelia Blackwater when the patient saw him.   DISCHARGE DIAGNOSES:  AXIS I: Mood disorder not otherwise specified, alcohol  and marijuana dependence, early remission.  AXIS II: Deferred  AXIS III: Gastroesophageal reflux disease.  AXIS IV: Stressors severe.  AXIS V: Global assessment of functioning on discharge 65.      Anselm Jungling, MD  Electronically Signed     SPB/MEDQ  D:  09/18/2005  T:  09/18/2005  Job:  [id number]

## 2010-06-01 NOTE — Procedures (Signed)
NAME:  [name], [name]              ACCOUNT NO.:  [id number]   MEDICAL RECORD NO.:  [id number]          PATIENT TYPE:  INP   LOCATION:  IC04                          FACILITY:  [facility]   PHYSICIAN:  Edward L. Juanetta Gosling, M.D.DATE OF BIRTH:  07-09-1980   DATE OF PROCEDURE:  DATE OF DISCHARGE:                                EKG INTERPRETATION   RHYTHM:  Sinus, 2038, September 11, 2005.   The rhythm is sinus tachycardia with a rate of about 110.  There are PVCs  versus fusion beats.  There is right axis deviation.  QT interval was  prolonged.  Abnormal electrocardiogram.      Oneal Deputy. Juanetta Gosling, M.D.  Electronically Signed     ELH/MEDQ  D:  09/13/2005  T:  09/13/2005  Job:  [id number]

## 2010-06-01 NOTE — Group Therapy Note (Signed)
NAME:  [name], [name]              ACCOUNT NO.:  [id number]   MEDICAL RECORD NO.:  [id number]          PATIENT TYPE:  INP   LOCATION:  A201                          FACILITY:  [facility]   PHYSICIAN:  Margaretmary Dys, M.D.DATE OF BIRTH:  [birth year]   DATE OF PROCEDURE:  09/14/2005  DATE OF DISCHARGE:                                   PROGRESS NOTE   SUBJECTIVE:  The patient was extubated successfully yesterday in the morning  and has done fairly well since.  He has been quite responsive.  The patient  told me he overdosed on his Depakote in a suicide attempt.   OBJECTIVE:  GENERAL:  The patient is comfortable, not in acute distress, well oriented  to time, place, and person.  VITAL SIGNS:  Blood pressure 152/96, pulse 90, respirations 18, T-max 99,  oxygen saturation 99% on room air.  HEENT:  Normocephalic, atraumatic, oral mucosa moist with no exudates.  NECK:  Supple, no JVD, no lymphadenopathy.  LUNGS:  Clear clinically, good air entry bilaterally.  HEART:  S1 and S2 regular, no S3, S4, gallops, or rubs.  ABDOMEN:  Soft, nontender, bowel sounds positive, no masses palpable.  Marland Kitchen  EXTREMITIES:  No edema or no induration was noted.  CNS:  Grossly intact with no focal deficits.   LABORATORY DATA:  White blood cell count 6.9, hemoglobin 14.2, hematocrit  42.9, platelet count 185, with no left shift.  Sodium 143, potassium 4.4,  chloride 110, CO2 28, glucose 93, BUN 2, creatinine 0.9, calcium 9.6.  Serum  valproic acid level 116.   ASSESSMENT/PLAN:  Drug overdose with Depakote, blood levels still in the  toxic range today, but much better.  The patient was successfully extubated  yesterday.  He is eating and wide awake.  We will await his levels to return  to the therapeutic range and also await ACT team consult.  We will continue  to hold all his home medications at this time due to risk of drug  interactions.  Watch his blood pressure carefully as it seems to be trending  up, the  patient has no prior history of hypertension.      Margaretmary Dys, M.D.  Electronically Signed     AM/MEDQ  D:  09/14/2005  T:  09/14/2005  Job:  [id number]

## 2010-10-09 LAB — RAPID URINE DRUG SCREEN, HOSP PERFORMED
Amphetamines: NOT DETECTED
Benzodiazepines: NOT DETECTED
Tetrahydrocannabinol: NOT DETECTED

## 2010-10-09 LAB — DIFFERENTIAL
Basophils Relative: 1
Lymphs Abs: 1.2
Monocytes Relative: 11
Neutro Abs: 10.5 — ABNORMAL HIGH
Neutrophils Relative %: 79 — ABNORMAL HIGH

## 2010-10-09 LAB — ETHANOL: Alcohol, Ethyl (B): <5

## 2010-10-09 LAB — BASIC METABOLIC PANEL
Calcium: 9
Creatinine, Ser: 0.87
GFR calc Af Amer: >60

## 2010-10-09 LAB — CBC
MCHC: 34.2
RBC: 5.55
WBC: 13.2 — ABNORMAL HIGH

## 2010-10-15 LAB — CBC
HCT: 50.8
Hemoglobin: 17
MCHC: 33.4
MCV: 82.2
RDW: 14.3

## 2010-10-15 LAB — BASIC METABOLIC PANEL
CO2: 28
Calcium: 9.6
Chloride: 102
Glucose, Bld: 112 — ABNORMAL HIGH
Sodium: 138

## 2010-10-15 LAB — DIFFERENTIAL
Basophils Absolute: 0
Basophils Relative: 0
Eosinophils Absolute: 0.1
Eosinophils Relative: 1
Monocytes Absolute: 1.1 — ABNORMAL HIGH
Neutro Abs: 6.9

## 2010-10-15 LAB — RAPID URINE DRUG SCREEN, HOSP PERFORMED
Amphetamines: NOT DETECTED
Barbiturates: NOT DETECTED
Opiates: NOT DETECTED

## 2010-11-02 ENCOUNTER — Encounter: Payer: Self-pay | Admitting: Emergency Medicine

## 2010-11-02 ENCOUNTER — Emergency Department (HOSPITAL_COMMUNITY)
Admission: EM | Admit: 2010-11-02 | Discharge: 2010-11-02 | Disposition: A | Discharge status: Other Acute Inpatient Hospital | Payer: 59 | Attending: Emergency Medicine | Admitting: Emergency Medicine

## 2010-11-02 ENCOUNTER — Inpatient Hospital Stay (HOSPITAL_COMMUNITY)
Admission: AD | Admit: 2010-11-02 | Discharge: 2010-11-07 | DRG: 885 | Disposition: A | Discharge status: Home / Self Care | Payer: 59 | Source: Ambulatory Visit | Attending: Psychiatry | Admitting: Psychiatry

## 2010-11-02 DIAGNOSIS — F1510 Other stimulant abuse, uncomplicated: Secondary | ICD-10-CM | POA: Insufficient documentation

## 2010-11-02 DIAGNOSIS — F3112 Bipolar disorder, current episode manic without psychotic features, moderate: Secondary | ICD-10-CM | POA: Insufficient documentation

## 2010-11-02 DIAGNOSIS — R45851 Suicidal ideations: Secondary | ICD-10-CM

## 2010-11-02 DIAGNOSIS — F39 Unspecified mood [affective] disorder: Principal | ICD-10-CM

## 2010-11-02 DIAGNOSIS — F1110 Opioid abuse, uncomplicated: Secondary | ICD-10-CM | POA: Insufficient documentation

## 2010-11-02 DIAGNOSIS — F1910 Other psychoactive substance abuse, uncomplicated: Secondary | ICD-10-CM

## 2010-11-02 DIAGNOSIS — Z818 Family history of other mental and behavioral disorders: Secondary | ICD-10-CM

## 2010-11-02 DIAGNOSIS — F1210 Cannabis abuse, uncomplicated: Secondary | ICD-10-CM | POA: Insufficient documentation

## 2010-11-02 DIAGNOSIS — R45850 Homicidal ideations: Secondary | ICD-10-CM

## 2010-11-02 HISTORY — DX: Bipolar disorder, unspecified: F31.9

## 2010-11-02 LAB — RAPID URINE DRUG SCREEN, HOSP PERFORMED
Amphetamines: POSITIVE — AB
Barbiturates: NOT DETECTED
Opiates: POSITIVE — AB
Tetrahydrocannabinol: POSITIVE — AB

## 2010-11-02 LAB — COMPREHENSIVE METABOLIC PANEL
ALT: 29 U/L (ref 0–53)
AST: 34 U/L (ref 0–37)
Albumin: 4.4 g/dL (ref 3.5–5.2)
Alkaline Phosphatase: 96 U/L (ref 39–117)
CO2: 25 mEq/L (ref 19–32)
Chloride: 102 mEq/L (ref 96–112)
Creatinine, Ser: 0.84 mg/dL (ref 0.50–1.35)
GFR calc non Af Amer: >90 mL/min (ref >90)
Potassium: 3.3 mEq/L — ABNORMAL LOW (ref 3.5–5.1)
Sodium: 139 mEq/L (ref 135–145)
Total Bilirubin: 0.9 mg/dL (ref 0.3–1.2)

## 2010-11-02 LAB — CBC
Platelets: 271 10*3/uL (ref 150–400)
RBC: 5.71 MIL/uL (ref 4.22–5.81)
RDW: 15.3 % (ref 11.5–15.5)
WBC: 10.3 10*3/uL (ref 4.0–10.5)

## 2010-11-02 NOTE — ED Provider Notes (Addendum)
Scribed for Keith Shi, MD, the patient was seen in room APA15/APA15 . This chart was scribed by Ellie Lunch. This patient's care was started at 9:19 AM.   CSN: [id number] Arrival date & time: 11/02/2010  9:12 AM   First MD Initiated Contact with Patient 11/02/10 0913      Chief Complaint  Patient presents with  . Medical Clearance    (Consider location/radiation/quality/duration/timing/severity/associated sxs/prior treatment) HPI [name] is a 30 y.o. male who presents to the Emergency Department for medical clearance. Pt with a reported h/o bipolar disorder and previous admission to psychiatric care reports he has been feeling "disgruntled" and has SI/HI for the past 2-3 days. Pt reports he currently takes medications for his bipolar disorder, but sometimes does not take them as prescribed. Pt denies any recent alcohol or illicit substance use. Denies fever. Denies having a gun in his house.   No past medical history on file.  No past surgical history on file.  No family history on file.  History  Substance Use Topics  . Smoking status: Not on file  . Smokeless tobacco: Not on file  . Alcohol Use: Not on file     Review of Systems 10 Systems reviewed and are negative for acute change except as noted in the HPI.  Allergies  Review of patient's allergies indicates not on file.  Home Medications  No current outpatient prescriptions on file.  BP 165/107  Pulse 86  Temp 99.4 F (37.4 C)  Resp 20  SpO2 97%  Physical Exam  Constitutional: He is oriented to person, place, and time. He appears well-developed and well-nourished.  HENT:  Head: Normocephalic and atraumatic.  Eyes: EOM are normal. Pupils are equal, round, and reactive to light.       3 mm pupils equal and reactive  Neck: Normal range of motion. Neck supple.  Cardiovascular: Normal rate, regular rhythm and normal heart sounds.   Pulmonary/Chest: Effort normal and breath sounds normal.    Abdominal: Soft. There is no tenderness.  Musculoskeletal: Normal range of motion. He exhibits no tenderness.  Neurological: He is alert and oriented to person, place, and time. No cranial nerve deficit.  Skin: Skin is warm and dry.   Procedures (including critical care time)  OTHER DATA REVIEWED: Nursing notes, vital signs, and past medical records reviewed.  DIAGNOSTIC STUDIES: Oxygen Saturation is 97% on room air, normal by my interpretation.    LABS / RADIOLOGY:  Labs Reviewed  COMPREHENSIVE METABOLIC PANEL - Abnormal; Notable for the following:    Potassium 3.3 (*)    Glucose, Bld 109 (*)    All other components within normal limits  URINE RAPID DRUG SCREEN (HOSP PERFORMED) - Abnormal; Notable for the following:    Opiates POSITIVE (*)    Amphetamines POSITIVE (*)    Tetrahydrocannabinol POSITIVE (*)    All other components within normal limits  CBC  ETHANOL   ED COURSE / COORDINATION OF CARE:  Plan: We'll consult ACT  for evaluation possible admission  3:23 PM patient accepted by Dr. Allena Katz at behavioral health and [facility] MDM:  SCRIBE ATTESTATION: I personally performed the services described in this documentation, which was scribed in my presence. The recorded information has been reviewed and considered.           Keith Shi, MD 11/02/10 1516  Keith Shi, MD 11/02/10 [phone]

## 2010-11-02 NOTE — ED Notes (Signed)
[facility] called for transport of Pt to [facility].  Per [facility] the Pt is to be held here till 6:30 pm before being transported.  [facility] is aware.

## 2010-11-02 NOTE — ED Notes (Signed)
Pt states "i don't know know why I'm here". Pt asked do you think you may harm yourself, pt states "i just feel disgruntled" "i hope not". Pt asked do you think you may harm someone else, pt states "i don't think so". Denies plan. edp at bedside.

## 2010-11-02 NOTE — ED Notes (Signed)
Pt disoriented pt follows instruction and cooperative with step by step instructions. Instructions needed to be repeated multiple times. Pt given instructions to change into scrubs pt stopped starred and then asked "what do I need to do.

## 2010-11-03 DIAGNOSIS — F1210 Cannabis abuse, uncomplicated: Secondary | ICD-10-CM

## 2010-11-03 DIAGNOSIS — F259 Schizoaffective disorder, unspecified: Secondary | ICD-10-CM

## 2010-11-03 DIAGNOSIS — F1510 Other stimulant abuse, uncomplicated: Secondary | ICD-10-CM

## 2010-11-03 DIAGNOSIS — F1110 Opioid abuse, uncomplicated: Secondary | ICD-10-CM

## 2010-11-07 NOTE — Assessment & Plan Note (Signed)
[name], CORPENING              ACCOUNT NO.:  [id number]  MEDICAL RECORD NO.:  [id number]  LOCATION:  0403                          FACILITY:  [facility]  PHYSICIAN:  Eulogio Ditch, MD DATE OF BIRTH:  [birth year]  DATE OF ADMISSION:  11/02/2010 DATE OF DISCHARGE:                      PSYCHIATRIC ADMISSION ASSESSMENT   TIME OF ASSESSMENT:  12:50 p.m.  IDENTIFYING INFORMATION:  A 30 year old male.  This is a voluntary admission.  He is single.  HISTORY OF PRESENT ILLNESS:  [name] presents by way of our emergency room where he reported he had not slept in 2 days and had had suicidal and homicidal thoughts for 2 days but is unable to articulate any particular targets or plans on how he would hurt himself or anyone else. He has a history of bipolar 1 disorder and was previously stabilized on Zyprexa.  This is his fifth Behavioral Health hospitalization.  Today he presents practically mute, gives very little information, has a fixed stare, appears to have significant thought processing problems.  He is unable to give a coherent history.  PAST PSYCHIATRIC HISTORY:  Last [facility] admission was in December of 2010. He has been diagnosed with bipolar disorder type 1, with a history of manic episodes.  He has a history of previous overdose on Symbyax and Depakote in the past.  Other medication trials include Abilify and Vyvanse.  Current outpatient treatment is unknown.  He was initially diagnosed with bipolar disorder in 2003 at [facility] in [place].  At that time he was apparently charged with trespassing, had been using marijuana and drinking alcohol under the age of 40.  In the past he was followed by Dr. Damita Dunnings for some time.  Has gone on and off medications in the past.  Was positive for marijuana on previous admissions.  SOCIAL HISTORY:  Graduated from high school in 2002.  Never married.  No children.  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  FAMILY HISTORY:  Maternal uncle with schizophrenia.  MEDICAL HISTORY:  Primary care provider is unknown.  MEDICAL PROBLEMS:  He denies acute medical problems.  MEDICATIONS:  Not known.  In the past he has taken Depakote, olanzapine, at one time took Abilify.  DRUG ALLERGIES:  None.  PHYSICAL EXAM:  He is uncooperative with physical exam at this time and will not permit me to auscultate his chest.  Did have a full physical exam in the emergency room.  He weighs 90 kg, 5 feet 6 inches tall.  He refuses to cooperate with any physical exam at this time. I am able to do nothing other than some gross inspection. His motor is smooth. He has no abnormal movements. Gait is normal. He is unable to tolerate a full review of systems but says that he has been having some problems with some constipation.  Urine drug screen is positive for cocaine, marijuana and opiates. Alcohol screen negative.  Metabolic panel reveals a mildly decreased potassium at 3.3.  Electrolytes are otherwise normal.  BUN 8, creatinine 0.84.  Liver enzymes normal.  SGOT 34, SGPT 29, alkaline phosphatase 96, total bilirubin 0.9.  CBC normal with  a hemoglobin of 15.7, platelets 271,000.  MENTAL STATUS EXAM:  A fully alert male with a blunt affect, guarded manner, has a significant thought latency, staring, will not come to the consult room and says he cannot come to the room with me to speak but is willing to speak to me briefly in the day room.  Does not want me to get too close to him and appears anxious, very controlled.  Speech is barely a whisper and essentially inaudible.  Does not initiate any speech. Answers a few questions yes and no.  Feels that he would feel better if he could get some medications, is willing to take Depakote.  He has a long response latency and appears to have significant problems with thought processing.  He is oriented to person and basic situation.  DIAGNOSES:   AXIS I:  Bipolar disorder, type 1, manic.  Polysubstance abuse. AXIS II:  Deferred. AXIS III:  No diagnosis. AXIS IV:  Deferred. AXIS V:  Current 35.  Past year 17 estimated.  PLAN:  Voluntarily admit him with a goal of improving his thought processing, alleviating any dangerous ideas.  He has agreed to take Depakote 1000 mg ER now and Zyprexa/Zydis 5 mg now, and then we will keep him on Depakote 1000 mg q.h.s. and Zyprexa/Zydis 10 mg p.o. q.h.s.     Margaret A. Lorin Picket, N.P.   ______________________________ Eulogio Ditch, MD    MAS/MEDQ  D:  11/03/2010  T:  11/03/2010  Job:  [id number]  Electronically Signed by Kari Baars N.P. on 11/05/2010 02:08:43 PM Electronically Signed by Eulogio Ditch  on 11/07/2010 02:35:57 PM

## 2010-11-09 NOTE — Discharge Summary (Signed)
[name], [name]              ACCOUNT NO.:  [id number]  MEDICAL RECORD NO.:  [id number]  LOCATION:  0400                          FACILITY:  [facility]  PHYSICIAN:  Eulogio Ditch, MD DATE OF BIRTH:  02-27-1980  DATE OF ADMISSION:  11/02/2010 DATE OF DISCHARGE:  11/07/2010                              DISCHARGE SUMMARY   IDENTIFYING INFORMATION:  This is a 30 year old African American male. This is a voluntary admission.  He is single.  HISTORY OF PRESENT ILLNESS:  [name] presents by way of our emergency room, where he reported he had not slept in 2 days and had suicidal and homicidal thoughts for about 2 days, but was unable to articulate any targets or plans on how he would hurt himself or anyone else.  He has a history of bipolar disorder and not currently taking any medications. On initial presentation here on the unit, he presented practically mute, giving very little information, has a fixed stare, appeared to have significant thought processing problems and is unable to give a coherent history.  PAST HISTORY:  Significant for a most recent [facility] admission in December 2010.  He has a history of manic episodes and had previously overdosed on Symbyax and Depakote in the past.  Other medication trials included Zyprexa, Abilify and Vyvanse.  He was initially diagnosed with bipolar disorder in 2003, at [facility] in [place].  He is under no current outpatient treatment.  MEDICAL EVALUATION:  He was medically evaluated in the emergency room with a full review of systems and physical exam documented there.  He was uncooperative with a focal physical exam here and clearly indicated he did not want me to come near him physically.  He weighs 90 kg, 5 feet 6 inches tall.  He did present with smooth motor exam.  No abnormal movements.  Normal gait.  He was unable to really give any information for review of systems.  His urine drug screen had been found to be positive for  cocaine, marijuana and opiates in the emergency room. Alcohol screen negative and other parameters unremarkable.  COURSE OF HOSPITALIZATION:  He was admitted to our acute stabilization unit and given a provisional diagnosis of bipolar disorder type 1, manic and polysubstance abuse.  Our initial goal was to improve his thought processing and alleviate any dangerous ideas and after some discussion, he was able to indicate that he was willing to take Depakote ER 1000 mg loading dose, and Zyprexa Zydis 5 mg.  We then continued him on 1000 mg at bedtime and Zyprexa Zydis 10 mg p.o. nightly.  On the second day his speech was a bit more fluent, he was able to greet [place], but still very reclusive, quite guarded, both in affect and manner.  Unable to attend groups or tolerate being around in the milieu.  He continued to take all medications and expressed no dangerous ideas.  By the third day he was able to say that he felt that his mind was talking to him as his thoughts were very fast at times and quite disorganized.  He denied any side effects with the medications and we continued with the plan.  He did attend discharge  planning group by the third day, but did not significantly participate.  By 11/06/2010, he was able to be articulate about his job.  He works full-time as a Museum/gallery exhibitions officer.  He was wanting to get back to work.  He was living with his mother and was able to return there.  He requested discharge so he could return to his job and was ready for discharge by the 24th.  Ultimately, we elected to discontinue the Depakote and he was continued on the Zyprexa.  DISCHARGE DIAGNOSES:  Axis I:  Mood disorder, not otherwise specified. Axis II:  Deferred. Axis III:  No diagnosis. Axis IV:  No diagnosis. Axis V:  Global Assessment of Functioning current 55.  DISCHARGE PLAN:  Follow up with the [facility] on November 15, 2010, at 2:20 p.m.  DISCHARGE MEDICATIONS:  Zyprexa  10 mg at bedtime.     Margaret A. Lorin Picket, N.P.   ______________________________ Eulogio Ditch, MD    MAS/MEDQ  D:  11/08/2010  T:  11/08/2010  Job:  [id number]  Electronically Signed by Kari Baars N.P. on 11/09/2010 08:25:49 AM Electronically Signed by Eulogio Ditch  on 11/09/2010 08:43:20 AM

## 2011-02-13 ENCOUNTER — Emergency Department (HOSPITAL_COMMUNITY)
Admission: EM | Admit: 2011-02-13 | Discharge: 2011-02-13 | Disposition: A | Discharge status: Home / Self Care | Payer: 59 | Source: Home / Self Care | Attending: Family Medicine | Admitting: Family Medicine

## 2011-02-13 ENCOUNTER — Encounter (HOSPITAL_COMMUNITY): Payer: Self-pay | Admitting: *Deleted

## 2011-02-13 DIAGNOSIS — A084 Viral intestinal infection, unspecified: Secondary | ICD-10-CM

## 2011-02-13 DIAGNOSIS — A088 Other specified intestinal infections: Secondary | ICD-10-CM

## 2011-02-13 MED ORDER — ONDANSETRON 4 MG PO TBDP
ORAL_TABLET | ORAL | Status: AC
  Filled 2011-02-13: qty 1

## 2011-02-13 MED ORDER — ONDANSETRON 4 MG PO TBDP: 4.0000 mg | ORAL_TABLET | Freq: Once | ORAL | Status: DC

## 2011-02-13 MED ORDER — ONDANSETRON HCL 4 MG PO TABS: 4.0000 mg | ORAL_TABLET | Freq: Four times a day (QID) | ORAL | Status: AC

## 2011-02-13 NOTE — ED Provider Notes (Signed)
History     CSN: [id number]  Arrival date & time 02/13/11  1450   First MD Initiated Contact with Patient 02/13/11 1533      Chief Complaint  Patient presents with  . Headache    (Consider location/radiation/quality/duration/timing/severity/associated sxs/prior treatment) Patient is a 31 y.o. male presenting with headaches. The history is provided by the patient.  Headache The primary symptoms include headaches, nausea and vomiting. Primary symptoms do not include syncope, loss of consciousness, altered mental status, seizures, dizziness, paresthesias or fever. The symptoms began 3 to 5 days ago. The symptoms are improving.  The headache is not associated with neck stiffness, paresthesias or weakness.  Additional symptoms do not include neck stiffness or weakness.    Past Medical History  Diagnosis Date  . Bipolar 1 disorder     History reviewed. No pertinent past surgical history.  No family history on file.  History  Substance Use Topics  . Smoking status: Current Everyday Smoker  . Smokeless tobacco: Not on file  . Alcohol Use: No      Review of Systems  Constitutional: Negative for fever.  HENT: Negative for neck stiffness.   Respiratory: Negative.   Cardiovascular: Negative.  Negative for syncope.  Gastrointestinal: Positive for nausea, vomiting and diarrhea.  Genitourinary: Negative.   Skin: Negative.   Neurological: Positive for headaches. Negative for dizziness, seizures, loss of consciousness, weakness and paresthesias.  Psychiatric/Behavioral: Negative for altered mental status.    Allergies  Review of patient's allergies indicates no known allergies.  Home Medications   Current Outpatient Rx  Name Route Sig Dispense Refill  . LAMOTRIGINE 100 MG PO TABS Oral Take 100 mg by mouth daily.    . AMPHETAMINE-DEXTROAMPHET ER 30 MG PO CP24 Oral Take 30 mg by mouth every morning.      Marland Kitchen HYDROCODONE-HOMATROPINE 5-1.5 MG/5ML PO SYRP Oral Take 5 mLs by mouth  every 4 (four) hours as needed. FOR COUGH     . LEVOFLOXACIN 500 MG PO TABS Oral Take 500 mg by mouth daily. STARTED ON 11-01-10 FOR 7 DAYS    . OLANZAPINE 15 MG PO TABS Oral Take 15 mg by mouth at bedtime.      Marland Kitchen ONDANSETRON HCL 4 MG PO TABS Oral Take 1 tablet (4 mg total) by mouth every 6 (six) hours. 8 tablet 0    BP 137/69  Pulse 76  Temp(Src) 99.7 F (37.6 C) (Oral)  Resp 14  SpO2 96%  Physical Exam  Nursing note and vitals reviewed. Constitutional: He is oriented to person, place, and time. He appears well-developed and well-nourished.  HENT:  Head: Normocephalic.  Nose: Nose normal.  Mouth/Throat: Oropharynx is clear and moist.  Eyes: Conjunctivae are normal. Pupils are equal, round, and reactive to light.  Neck: Normal range of motion. Neck supple.  Cardiovascular: Normal rate, normal heart sounds and intact distal pulses.   Pulmonary/Chest: Breath sounds normal.  Abdominal: Soft. Bowel sounds are normal. There is no tenderness. There is no rebound and no guarding.  Musculoskeletal: Normal range of motion.  Lymphadenopathy:    He has no cervical adenopathy.  Neurological: He is alert and oriented to person, place, and time.  Skin: Skin is warm and dry.  Psychiatric: He has a normal mood and affect.    ED Course  Procedures (including critical care time)  Labs Reviewed - No data to display No results found.   1. Influenza   2. Viral gastroenteritis       MDM  Barkley Bruns, MD 02/13/11 [phone]

## 2011-02-13 NOTE — ED Notes (Signed)
Pt  Has  Symptoms  Of  Cough  Headache  Some  Diarrhea          And  generalised  Aching  Symptoms  X      4  Days   Pt  Has  Somewhat  Dry  musous  Membranes    And  May  Not  Be   Drinking  Enough   Liquids

## 2011-02-28 ENCOUNTER — Other Ambulatory Visit (HOSPITAL_COMMUNITY): Payer: Self-pay | Admitting: Family Medicine

## 2011-02-28 ENCOUNTER — Ambulatory Visit (HOSPITAL_COMMUNITY)
Admission: RE | Admit: 2011-02-28 | Discharge: 2011-02-28 | Disposition: A | Discharge status: Home / Self Care | Payer: 59 | Source: Ambulatory Visit | Attending: Family Medicine | Admitting: Family Medicine

## 2011-02-28 DIAGNOSIS — J209 Acute bronchitis, unspecified: Secondary | ICD-10-CM

## 2011-02-28 DIAGNOSIS — R05 Cough: Secondary | ICD-10-CM | POA: Insufficient documentation

## 2011-02-28 DIAGNOSIS — R059 Cough, unspecified: Secondary | ICD-10-CM | POA: Insufficient documentation

## 2011-02-28 DIAGNOSIS — J111 Influenza due to unidentified influenza virus with other respiratory manifestations: Secondary | ICD-10-CM | POA: Insufficient documentation

## 2012-04-26 ENCOUNTER — Emergency Department (HOSPITAL_COMMUNITY): Payer: 59

## 2012-04-26 ENCOUNTER — Emergency Department (HOSPITAL_COMMUNITY)
Admission: EM | Admit: 2012-04-26 | Discharge: 2012-04-27 | Disposition: A | Discharge status: Other Acute Inpatient Hospital | Payer: 59 | Attending: Emergency Medicine | Admitting: Emergency Medicine

## 2012-04-26 ENCOUNTER — Encounter (HOSPITAL_COMMUNITY): Payer: Self-pay | Admitting: Emergency Medicine

## 2012-04-26 DIAGNOSIS — F319 Bipolar disorder, unspecified: Secondary | ICD-10-CM | POA: Insufficient documentation

## 2012-04-26 DIAGNOSIS — Z79899 Other long term (current) drug therapy: Secondary | ICD-10-CM | POA: Insufficient documentation

## 2012-04-26 DIAGNOSIS — I10 Essential (primary) hypertension: Secondary | ICD-10-CM | POA: Insufficient documentation

## 2012-04-26 DIAGNOSIS — F17200 Nicotine dependence, unspecified, uncomplicated: Secondary | ICD-10-CM | POA: Insufficient documentation

## 2012-04-26 HISTORY — DX: Essential (primary) hypertension: I10

## 2012-04-26 LAB — RAPID URINE DRUG SCREEN, HOSP PERFORMED
Amphetamines: NOT DETECTED
Barbiturates: NOT DETECTED
Benzodiazepines: NOT DETECTED
Tetrahydrocannabinol: POSITIVE — AB

## 2012-04-26 LAB — CBC WITH DIFFERENTIAL/PLATELET
Basophils Relative: 0 % (ref 0–1)
Eosinophils Absolute: 0 10*3/uL (ref 0.0–0.7)
Lymphs Abs: 1.7 10*3/uL (ref 0.7–4.0)
MCH: 26.6 pg (ref 26.0–34.0)
Neutro Abs: 5.2 10*3/uL (ref 1.7–7.7)
Neutrophils Relative %: 66 % (ref 43–77)
Platelets: 251 10*3/uL (ref 150–400)
RBC: 5.41 MIL/uL (ref 4.22–5.81)

## 2012-04-26 LAB — COMPREHENSIVE METABOLIC PANEL
ALT: 14 U/L (ref 0–53)
Albumin: 4.3 g/dL (ref 3.5–5.2)
Alkaline Phosphatase: 80 U/L (ref 39–117)
Chloride: 102 mEq/L (ref 96–112)
Glucose, Bld: 137 mg/dL — ABNORMAL HIGH (ref 70–99)
Potassium: 3 mEq/L — ABNORMAL LOW (ref 3.5–5.1)
Sodium: 139 mEq/L (ref 135–145)
Total Protein: 7.1 g/dL (ref 6.0–8.3)

## 2012-04-26 LAB — ETHANOL: Alcohol, Ethyl (B): <11 mg/dL (ref 0–11)

## 2012-04-26 MED ORDER — HALOPERIDOL LACTATE 5 MG/ML IJ SOLN
5.0000 mg | Freq: Once | INTRAMUSCULAR | Status: AC
  Administered 2012-04-26: 5 mg via INTRAMUSCULAR
  Filled 2012-04-26: qty 1

## 2012-04-26 MED ORDER — HALOPERIDOL LACTATE 5 MG/ML IJ SOLN
5.0000 mg | Freq: Once | INTRAMUSCULAR | Status: DC
  Filled 2012-04-26: qty 1

## 2012-04-26 NOTE — ED Provider Notes (Signed)
History  This chart was scribed for Keith Octave, MD by Greggory Stallion, ED Scribe. This patient was seen in room APA17/APA17 and the patient's care was started at 8:37 PM.  CSN: [id number]  Arrival date & time 04/26/12  2016   First MD Initiated Contact with Patient 04/26/12 2027      Chief Complaint  Patient presents with  . V70.1    Level 5 caveat - Altered Mental Status  The history is provided by a parent. No language interpreter was used.    [name] is a 32 y.o. male with h/o bi-polar disorder who presents to the Emergency Department complaining of AMS that started 40 minutes ago. Pt's mother states that he lives with her and has not been taking his bipolar medications. He is currently on clonidine, Zyprexa and Lamictal. Pt's mother says that he has not been eating or drinking normally but was holding conversations up until 40 minutes ago. She states that he has been irritable and confused today but reports similar episodes during times when the pt is non-complaint. She denies any sick contacts and denies fever, cough, emesis, diarrhea and rash as associated symptoms. Pt's mother states that he smokes marijuana but does not take any other drugs. Pt currently answers all questions with fits of giggles and cannot provide further history.  Past Medical History  Diagnosis Date  . Bipolar 1 disorder   . Hypertension     History reviewed. No pertinent past surgical history.  No family history on file.  History  Substance Use Topics  . Smoking status: Current Every Day Smoker  . Smokeless tobacco: Not on file  . Alcohol Use: No      Review of Systems  Unable to perform ROS: Mental status change    Allergies  Review of patient's allergies indicates no known allergies.  Home Medications   Current Outpatient Rx  Name  Route  Sig  Dispense  Refill  . cloNIDine (CATAPRES) 0.1 MG tablet   Oral   Take 0.1 mg by mouth 3 (three) times daily.         Marland Kitchen  amphetamine-dextroamphetamine (ADDERALL XR) 30 MG 24 hr capsule   Oral   Take 30 mg by mouth every morning.           . lamoTRIgine (LAMICTAL) 100 MG tablet   Oral   Take 100 mg by mouth 2 (two) times daily.          Marland Kitchen OLANZapine (ZYPREXA) 15 MG tablet   Oral   Take 15 mg by mouth at bedtime.             Triage Vitals: BP 170/98  Pulse 124  Resp 20  SpO2 98%  Physical Exam  Nursing note and vitals reviewed. Constitutional: He appears well-developed and well-nourished. No distress.  HENT:  Head: Normocephalic and atraumatic.  Small abrasion to lower lip  Eyes: Conjunctivae and EOM are normal. Pupils are equal, round, and reactive to light.  Neck: Normal range of motion. Neck supple. No tracheal deviation present.  no meningismus  Cardiovascular: Normal rate, regular rhythm and normal heart sounds.   Pulmonary/Chest: Effort normal. No respiratory distress.  Musculoskeletal: Normal range of motion.  Moving all extremities  Neurological: He is alert.  Inappropriate laughter, follows some commands, does not answer questions, no clonus  Skin: Skin is warm and dry.  Psychiatric: He has a normal mood and affect. His behavior is normal.    ED Course  Procedures (including  critical care time)  DIAGNOSTIC STUDIES: Oxygen Saturation is 98% on room air, normal by my interpretation.    COORDINATION OF CARE: 8:43 PM-Discussed treatment plan with mother which includes CT of head, CBC panel, CMP, urine rapid drug screen and tele psych and she agreed to plan.   9:20 PM-Nurse informed me that pt became belligerent with mother and got up to leave. Upon recheck, pt answers all questions with "working on problems with weed". Will commit under IVC.  Labs Reviewed  COMPREHENSIVE METABOLIC PANEL - Abnormal; Notable for the following:    Potassium 3.0 (*)    Glucose, Bld 137 (*)    All other components within normal limits  SALICYLATE LEVEL - Abnormal; Notable for the following:     Salicylate Lvl <2.0 (*)    All other components within normal limits  CK - Abnormal; Notable for the following:    Total CK 286 (*)    All other components within normal limits  CBC WITH DIFFERENTIAL  TROPONIN I  ETHANOL  ACETAMINOPHEN LEVEL  AMMONIA  LACTIC ACID, PLASMA  URINE RAPID DRUG SCREEN (HOSP PERFORMED)   Ct Head Wo Contrast  04/26/2012  *RADIOLOGY REPORT*  Clinical Data: Altered mental status.  CT HEAD WITHOUT CONTRAST  Technique:  Contiguous axial images were obtained from the base of the skull through the vertex without contrast.  Comparison: 01/06/2010  Findings: There is no acute intracranial hemorrhage, infarction, or mass lesion.  Brain parenchyma is normal.  Osseous structures are normal.  IMPRESSION: Normal exam.   Original Report Authenticated By: Francene Boyers, M.D.      No diagnosis found.    MDM  Altered mental status with probable psychosis. Unable to answer questions. Moving all extremities. No evidence of trauma.  Medical workup was unremarkable. Patient continues to be confused, inappropriate, acting bizarrely. The only thing he will say is that he is "working on problems with weed."  He appears to be psychotic possibly from his bipolar disorder. We'll obtain telemetry psychiatry consultation. IVC paperwork filled out as patient was talking of leaving but did not make an attempt.   Date: 04/26/2012  Rate: 115  Rhythm: sinus tachycardia  QRS Axis: normal  Intervals: normal  ST/T Wave abnormalities: normal  Conduction Disutrbances:none  Narrative Interpretation:   Old EKG Reviewed: none available    I personally performed the services described in this documentation, which was scribed in my presence. The recorded information has been reviewed and is accurate.       Keith Octave, MD 04/26/12 2330

## 2012-04-26 NOTE — ED Notes (Addendum)
Dr Manus Gunning ordered in and out cath, states to give 5mg  haldol IM prior to this. Pt continues to act erratically and is uncooperative. Will not give [place] a urine sample

## 2012-04-26 NOTE — ED Notes (Signed)
Pt changed into scrubs, continues to be obsessed with weed. States "I wouldn't mind dying if i could smoke weed" Unable to form coherent sentences, Still cannot hold a conversation. Laughs innproprietary

## 2012-04-26 NOTE — ED Notes (Signed)
Mother brings patient in to ER with c/o not taking his bipolar medications.  Patient not answering questions. Patient continues to state he needs weed.

## 2012-04-26 NOTE — ED Notes (Signed)
Pt mentioned that he wanted to leave, dr rancour notified, IVC papers in process, [employer] at bedside at this time, pt has not tried to leave.

## 2012-04-26 NOTE — ED Notes (Signed)
Pt was not cooperating in CT scan, order for 5mg  haldol obtained. Staff was able to get image before I gave medication, dr rancour aware, will cancel order.

## 2012-04-26 NOTE — ED Notes (Signed)
Pt gave a urine sample without the need for a cath.

## 2012-04-26 NOTE — ED Notes (Signed)
Per pts mother patient has stopped taking his bipolar meds. Pt appears to be acting erratically at this time. Will at times follow commands but is unable to hold a conversation, has periods of laughter followed by periods of silence. Did deny SI and HI when asked but I am unsure if the pt understood the question.

## 2012-04-27 ENCOUNTER — Inpatient Hospital Stay (HOSPITAL_COMMUNITY)
Admission: AD | Admit: 2012-04-27 | Discharge: 2012-05-01 | DRG: 885 | Disposition: A | Discharge status: Home / Self Care | Payer: 59 | Source: Intra-hospital | Attending: Psychiatry | Admitting: Psychiatry

## 2012-04-27 ENCOUNTER — Encounter (HOSPITAL_COMMUNITY): Payer: Self-pay

## 2012-04-27 DIAGNOSIS — I10 Essential (primary) hypertension: Secondary | ICD-10-CM | POA: Diagnosis present

## 2012-04-27 DIAGNOSIS — F3130 Bipolar disorder, current episode depressed, mild or moderate severity, unspecified: Secondary | ICD-10-CM

## 2012-04-27 DIAGNOSIS — F1210 Cannabis abuse, uncomplicated: Secondary | ICD-10-CM

## 2012-04-27 DIAGNOSIS — Z79899 Other long term (current) drug therapy: Secondary | ICD-10-CM

## 2012-04-27 MED ORDER — AMPHETAMINE-DEXTROAMPHET ER 30 MG PO CP24
30.0000 mg | ORAL_CAPSULE | ORAL | Status: DC
  Administered 2012-04-27: 30 mg via ORAL
  Filled 2012-04-27 (×2): qty 1

## 2012-04-27 MED ORDER — ONDANSETRON HCL 4 MG PO TABS
4.0000 mg | ORAL_TABLET | Freq: Three times a day (TID) | ORAL | Status: DC | PRN
Start: 2012-04-27 — End: 2012-04-27

## 2012-04-27 MED ORDER — LORAZEPAM 2 MG/ML IJ SOLN: 1.0000 mg | Freq: Once | INTRAMUSCULAR | Status: AC

## 2012-04-27 MED ORDER — OLANZAPINE 7.5 MG PO TABS
15.0000 mg | ORAL_TABLET | Freq: Every day | ORAL | Status: DC
  Administered 2012-04-27: 15 mg via ORAL
  Filled 2012-04-27: qty 6
  Filled 2012-04-27 (×2): qty 2

## 2012-04-27 MED ORDER — MAGNESIUM HYDROXIDE 400 MG/5ML PO SUSP: 30.0000 mL | Freq: Every day | ORAL | Status: DC | PRN

## 2012-04-27 MED ORDER — POTASSIUM CHLORIDE CRYS ER 20 MEQ PO TBCR
40.0000 meq | EXTENDED_RELEASE_TABLET | Freq: Once | ORAL | Status: AC
  Administered 2012-04-27: 40 meq via ORAL
  Filled 2012-04-27: qty 1
  Filled 2012-04-27: qty 2

## 2012-04-27 MED ORDER — ACETAMINOPHEN 325 MG PO TABS
650.0000 mg | ORAL_TABLET | Freq: Four times a day (QID) | ORAL | Status: DC | PRN
  Administered 2012-04-29 – 2012-04-30 (×2): 650 mg via ORAL

## 2012-04-27 MED ORDER — ALUM & MAG HYDROXIDE-SIMETH 200-200-20 MG/5ML PO SUSP: 30.0000 mL | ORAL | Status: DC | PRN

## 2012-04-27 MED ORDER — LAMOTRIGINE 100 MG PO TABS
100.0000 mg | ORAL_TABLET | Freq: Two times a day (BID) | ORAL | Status: DC
  Administered 2012-04-27 – 2012-04-28 (×2): 100 mg via ORAL
  Filled 2012-04-27 (×5): qty 1

## 2012-04-27 MED ORDER — CLONIDINE HCL 0.1 MG PO TABS
0.1000 mg | ORAL_TABLET | Freq: Three times a day (TID) | ORAL | Status: DC
  Administered 2012-04-28 – 2012-05-01 (×9): 0.1 mg via ORAL
  Filled 2012-04-27 (×14): qty 1

## 2012-04-27 MED ORDER — LORAZEPAM 1 MG PO TABS
1.0000 mg | ORAL_TABLET | Freq: Once | ORAL | Status: AC
  Administered 2012-04-27: 1 mg via ORAL
  Filled 2012-04-27: qty 1

## 2012-04-27 MED ORDER — CLONIDINE HCL 0.1 MG PO TABS
0.1000 mg | ORAL_TABLET | Freq: Three times a day (TID) | ORAL | Status: DC
  Administered 2012-04-27 (×2): 0.1 mg via ORAL
  Filled 2012-04-27 (×2): qty 1

## 2012-04-27 MED ORDER — OLANZAPINE 5 MG PO TABS
15.0000 mg | ORAL_TABLET | Freq: Every day | ORAL | Status: DC
  Filled 2012-04-27: qty 2

## 2012-04-27 MED ORDER — NICOTINE 21 MG/24HR TD PT24
21.0000 mg | MEDICATED_PATCH | Freq: Every day | TRANSDERMAL | Status: DC
  Administered 2012-04-27: 21 mg via TRANSDERMAL
  Filled 2012-04-27: qty 1

## 2012-04-27 MED ORDER — IBUPROFEN 400 MG PO TABS: 600.0000 mg | ORAL_TABLET | Freq: Three times a day (TID) | ORAL | Status: DC | PRN

## 2012-04-27 MED ORDER — NICOTINE 14 MG/24HR TD PT24
14.0000 mg | MEDICATED_PATCH | Freq: Every day | TRANSDERMAL | Status: DC
  Administered 2012-04-28 – 2012-05-01 (×4): 14 mg via TRANSDERMAL
  Filled 2012-04-27 (×6): qty 1

## 2012-04-27 MED ORDER — TRAZODONE HCL 50 MG PO TABS
50.0000 mg | ORAL_TABLET | Freq: Every evening | ORAL | Status: DC | PRN
  Administered 2012-04-28 – 2012-04-30 (×3): 50 mg via ORAL
  Filled 2012-04-27 (×6): qty 1
  Filled 2012-04-27: qty 6
  Filled 2012-04-27 (×5): qty 1
  Filled 2012-04-27: qty 6

## 2012-04-27 MED ORDER — LAMOTRIGINE 100 MG PO TABS
100.0000 mg | ORAL_TABLET | Freq: Two times a day (BID) | ORAL | Status: DC
  Administered 2012-04-27: 100 mg via ORAL
  Filled 2012-04-27 (×3): qty 1

## 2012-04-27 MED ORDER — POTASSIUM CHLORIDE CRYS ER 20 MEQ PO TBCR
20.0000 meq | EXTENDED_RELEASE_TABLET | Freq: Two times a day (BID) | ORAL | Status: AC
  Administered 2012-04-27 – 2012-04-30 (×6): 20 meq via ORAL
  Filled 2012-04-27 (×7): qty 1

## 2012-04-27 NOTE — B[facility] Counselor (Signed)
The patient has been accepted as an involuntary admission to [facility] by Verne Spurr PA to the service of Dr Claudine Mouton. He is assigned to room 407-01. He will be transported by [facility]. Due to paranoia he would not sign released of information forms. He had been threaten to leave earlier and the [employer] was called and remain with him. Due to lack of time, [facility] was not preserted. Dr  Bebe Shaggy informed of pending transfer.

## 2012-04-27 NOTE — ED Notes (Signed)
[employer] called after request from Pts nurse, because Pt is trying to leave.

## 2012-04-27 NOTE — Progress Notes (Signed)
Involuntary admission for a 32 y.o. Anxious and paranoid male.  Pt. Reports that he ran out of his medications (Zyprexa) and has not had any in 2 months.  Pt. Denies SI/HI and denies A/V hallucinations and contracts for safety.  Reports usage of THC  Every other day for the last 12 years.  Reports that he would like to try to stay off of weed, cigarretes and cigars.  Pt. Given and consumed 75% of his food.  Fluids given and pt. Oriented to unit.

## 2012-04-27 NOTE — Progress Notes (Signed)
D: Patient in the dayroom.  Patient appears drowsy.  Patient states he feels tired.  Patient states he has been off of his medications and states he has been smoking marijuana on occasion.  Patient denies SI/HI and denies AVH. Patient states he has been paranoid. A: Staff to monitor Q 15 mins for safety.  Encouragement and support offered.  Scheduled medications administered per orders.  EKG done tonight.   R: Patient remains safe on the unit.  Patient did attend group but it was reported that he fell asleep when the group started.  Patient cooperative and taking administered medications.

## 2012-04-27 NOTE — ED Notes (Signed)
Patient wanting to leave. Patient informed that he is IVC and that he is unable to leave. Explained to patient situation and that a psychiatrist deemed it was unsafe for him to go home. Patient wanting to speak with psychiatrist. Dr Bebe Shaggy aware.

## 2012-04-27 NOTE — ED Notes (Signed)
telepsych report given to dr Judd Lien

## 2012-04-27 NOTE — ED Notes (Signed)
Tommy from Act team and [employer] here. Patient calm and cooperative at this time.

## 2012-04-27 NOTE — Tx Team (Signed)
Initial Interdisciplinary Treatment Plan  PATIENT STRENGTHS: (choose at least two) Motivation for treatment/growth Physical Health Supportive family/friends  PATIENT STRESSORS: Financial difficulties Medication change or noncompliance Substance abuse   PROBLEM LIST: Problem List/Patient Goals Date to be addressed Date deferred Reason deferred Estimated date of resolution  "to try to stay off weed,cigarretes and cigars" 04/27/12                                                      DISCHARGE CRITERIA:  Improved stabilization in mood, thinking, and/or behavior Need for constant or close observation no longer present Verbal commitment to aftercare and medication compliance  PRELIMINARY DISCHARGE PLAN: Return to previous living arrangement  PATIENT/FAMIILY INVOLVEMENT: This treatment plan has been presented to and reviewed with the patient, AZAZEL FRANZE, and/or family member,   The patient and family have been given the opportunity to ask questions and make suggestions.  Shawanna Zanders Dawkins 04/27/2012, 7:02 PM

## 2012-04-27 NOTE — Progress Notes (Signed)
Pt is a 32 y/o AAM, accepted to [facility] for mgmt of Schizophrenia, with noted hypokalemia of 3.0, reportedly treated at [facility] ED with po K supplementation x one. Pt denies any c/o CP, palpitations, fatigue or muscle weakness at this time. Pt denies any preceding N/V or diarrhea symptoms.  O: BP 138/98, P112   Gen 31 y/o AAm NAD  Pulm: CTA  Cardio: audible s1,s2 without M/G/R  Neuro: CN 2 - 12 intact, Patellar DTR 2/4 bilat  EKG: NSR, normal axis without ST depression, T wave inversion or U waves of the precordial leads  A/P: Moderate hypokalemia without EKG changes, will continue PO supplementation, check Mg and CMP in am. Consider transfer to [facility] ED if K isn't correcting due to inadequate po supplementation.

## 2012-04-27 NOTE — BH Assessment (Addendum)
Assessment Note   [name] is an 32 y.o. male.The patient was brought to the ED by his mother. She reports he has not been taking his medications for an unknown period of time.  He is not homicidal nor suicidal. 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 He was alert with the tele-psych physician, then he  Became guarded and would not answer questions. At times he will speak with the staff and at others he lies on the bed and does not talk.  Spoke with his mother, he has had multiple admissions to [facility] and he is followed by [facility] . Dr Bebe Shaggy is in agreement with the tele-psych recommendation of inpatient care. Patient is IVC. Patient referred to [facility] .  Axis I:  Bipolar Disorder, unspecified- 296.80 Axis II: Deferred Axis III:  Past Medical History  Diagnosis Date  . Bipolar 1 disorder   . Hypertension    Axis IV: other psychosocial or environmental problems, problems related to social environment and problems with primary support group Axis V: 31-40 impairment in reality testing  Past Medical History:  Past Medical History  Diagnosis Date  . Bipolar 1 disorder   . Hypertension     History reviewed. No pertinent past surgical history.  Family History: No family history on file.  Social History:  reports that he has been smoking.  He does not have any smokeless tobacco history on file. He reports that he uses illicit drugs (Marijuana). He reports that he does not drink alcohol.  Additional Social History:     CIWA: CIWA-Ar BP: 135/77 mmHg Pulse Rate: 80 COWS:    Allergies: No Known Allergies  Home Medications:  (Not in a hospital admission)  OB/GYN Status:  No LMP for male patient.  General Assessment Data Location of Assessment: [facility] ACT Assessment: Yes Living Arrangements: Parent (mother) Can pt return to current living arrangement?: Yes Admission Status: Involuntary Is patient capable of signing voluntary admission?: No Transfer  from: Acute Hospital Referral Source: MD  Education Status Is patient currently in school?: No  Risk to self Suicidal Ideation: No Suicidal Intent: No Is patient at risk for suicide?: No Suicidal Plan?: No Access to Means: No What has been your use of drugs/alcohol within the last 12 months?: will not answer  Previous Attempts/Gestures: No How many times?: 0 Other Self Harm Risks: not taking medications Triggers for Past Attempts: None known Intentional Self Injurious Behavior: None Family Suicide History: No Recent stressful life event(s): Other (Comment) (increased psychotic behaviors due to not taking medications) Persecutory voices/beliefs?: Yes Depression: Yes Depression Symptoms: Isolating;Loss of interest in usual pleasures Substance abuse history and/or treatment for substance abuse?: No Suicide prevention information given to non-admitted patients: Not applicable  Risk to Others Homicidal Ideation: No Thoughts of Harm to Others: No Current Homicidal Intent: No Current Homicidal Plan: No Access to Homicidal Means: No History of harm to others?: No Assessment of Violence: None Noted Does patient have access to weapons?: No Criminal Charges Pending?: No Does patient have a court date: No  Psychosis Hallucinations:  (UTA- appears to be psychotic) Delusions: Unspecified  Mental Status Report Appear/Hygiene: Improved Eye Contact: Other (Comment) (patient will not open his eyes) Motor Activity: Freedom of movement Speech: Elective mutism Level of Consciousness: Quiet/awake (will not open eyes, but  ; patient was alert for tele-psych) Mood: Other (Comment) (UTA) Affect: Unable to Assess (pattient will not answer questions) Anxiety Level: None Thought Processes:  (UTA) Judgement: Impaired Orientation: Place;Other (  Comment) (knows he is in hospital) Obsessive Compulsive Thoughts/Behaviors: None  Cognitive Functioning Concentration: Decreased Memory: Recent  Impaired;Remote Impaired IQ: Average Insight: Poor Impulse Control: Poor Appetite: Good Weight Loss: 0 Weight Gain: 0 Sleep: No Change Vegetative Symptoms: None  ADLScreening [facility] Assessment Services) Patient's cognitive ability adequate to safely complete daily activities?: No Patient able to express need for assistance with ADLs?: No Independently performs ADLs?: Yes (appropriate for developmental age)  Abuse/Neglect [facility]) Physical Abuse:  (will not answer) Verbal Abuse:  (will  not answer) Sexual Abuse:  (will not answer)  Prior Inpatient Therapy Prior Inpatient Therapy: Yes Prior Therapy Dates: 2012 ( ) Prior Therapy Facilty/Provider(s): [facility] (multiple admissions) Reason for Treatment: psychosis  Prior Outpatient Therapy Prior Outpatient Therapy: Yes Prior Therapy Dates: 2013-2014 Prior Therapy Facilty/Provider(s): [facility]- Reason for Treatment: after care and medications  ADL Screening (condition at time of admission) Patient's cognitive ability adequate to safely complete daily activities?: No Patient able to express need for assistance with ADLs?: No Independently performs ADLs?: Yes (appropriate for developmental age)       Abuse/Neglect Assessment (Assessment to be complete while patient is alone) Physical Abuse:  (will not answer) Verbal Abuse:  (will  not answer) Sexual Abuse:  (will not answer) Values / Beliefs Cultural Requests During Hospitalization: None Spiritual Requests During Hospitalization: None        Additional Information 1:1 In Past 12 Months?: No CIRT Risk: No Elopement Risk: No Does patient have medical clearance?: Yes     Disposition:Patient accepted as an involuntary admission by Verne Spurr PA to the service of Dr Jannifer Franklin. He will be transported by [facility]. Dr Bebe Shaggy in agreement with this disposition.  Disposition Initial Assessment Completed for this Encounter: Yes Disposition of Patient:  Inpatient treatment program Type of inpatient treatment program: Adult  On Site Evaluation by:   Reviewed with Physician:     Jearld Pies 04/27/2012 9:32 AM

## 2012-04-27 NOTE — ED Notes (Signed)
Pt had telepsych, pt refused to speak with the psychiatrist. Did not say anything during the entire consult. Gave psychiatrist mothers phone number.

## 2012-04-27 NOTE — ED Notes (Signed)
Patient denies knowing his name, denies knowing who the [place] president is. Patient states that the month is February and also that the year is February. Denies being able to provide a numerical value for a guess at what the current year is. Patient knows that he is in the hospital and states that he is in [place].

## 2012-04-28 ENCOUNTER — Encounter (HOSPITAL_COMMUNITY): Payer: Self-pay | Admitting: Psychiatry

## 2012-04-28 DIAGNOSIS — F3130 Bipolar disorder, current episode depressed, mild or moderate severity, unspecified: Secondary | ICD-10-CM

## 2012-04-28 DIAGNOSIS — F1210 Cannabis abuse, uncomplicated: Secondary | ICD-10-CM

## 2012-04-28 LAB — COMPREHENSIVE METABOLIC PANEL
Albumin: 3.9 g/dL (ref 3.5–5.2)
Alkaline Phosphatase: 76 U/L (ref 39–117)
BUN: 8 mg/dL (ref 6–23)
Chloride: 102 mEq/L (ref 96–112)
Creatinine, Ser: 0.87 mg/dL (ref 0.50–1.35)
GFR calc Af Amer: >90 mL/min (ref >90)
Glucose, Bld: 103 mg/dL — ABNORMAL HIGH (ref 70–99)
Potassium: 3.8 mEq/L (ref 3.5–5.1)
Total Bilirubin: 0.6 mg/dL (ref 0.3–1.2)
Total Protein: 7 g/dL (ref 6.0–8.3)

## 2012-04-28 LAB — MAGNESIUM: Magnesium: 2.1 mg/dL (ref 1.5–2.5)

## 2012-04-28 MED ORDER — OLANZAPINE 5 MG PO TABS
5.0000 mg | ORAL_TABLET | Freq: Every day | ORAL | Status: DC
  Administered 2012-04-28: 5 mg via ORAL
  Filled 2012-04-28 (×3): qty 1

## 2012-04-28 MED ORDER — LAMOTRIGINE 25 MG PO TABS
50.0000 mg | ORAL_TABLET | Freq: Two times a day (BID) | ORAL | Status: DC
  Administered 2012-04-28 – 2012-05-01 (×6): 50 mg via ORAL
  Filled 2012-04-28: qty 2
  Filled 2012-04-28 (×2): qty 12
  Filled 2012-04-28 (×7): qty 2

## 2012-04-28 NOTE — Progress Notes (Signed)
D: Patient in the hallway on approach.  Patient states he is doing ok. Patient states he thinks he is taking too many different medications.  Patient states he is not used to taking different medications.  Patient paranoid and suspicious.  Patient stated another patient came into his room during the day and patient states he was nervous about it.  Patient denies SI/HI and denies AVH. A: Staff to monitor Q 15 mins for safety.  Encouragement and support offered.  Scheduled medications administered per orders.   R: Patient remains safe on the unit.  Patient attended group tonight.  Patient remains paranoid.  Patient taking administered medicaitons

## 2012-04-28 NOTE — Progress Notes (Signed)
D: Pt denies SI/HI/AVH. Pt is paranoid and anxious today. Pt voiced concerns about taking clonidine TID. Pt advised of clonidine order.  Pt forwards little information and presents with minimal interaction on the unit.  Pt affect is flat.  A: Medications administered as ordered per MD. Verbal support given. Pt encouraged to attend groups. 15 minute checks performed for safety. Shift assessment completed.   R: Pt is receptive to treatment.

## 2012-04-28 NOTE — Progress Notes (Signed)
The focus of this group is to help patients review their daily goal of treatment and discuss progress on daily workbooks. Pt attended the evening group session and responded to all discussion prompts from the Writer. Pt said that one good thing from today was that he started a list of things he wanted to do upon discharge, which made him happy to think about. Pt stated that he had no needs from nursing staff this evening. Pt smiled throughout group and appeared fully awake and alert as opposed to the drowsiness he experienced in previous days.

## 2012-04-28 NOTE — BHH Group Notes (Signed)
[facility] LCSW Aftercare Discharge Planning Group Note   04/28/2012 6:23 PM  Participation Quality:  Engaged  Mood/Affect:  Depressed and Flat  Depression Rating:  unknown  Anxiety Rating:  unknown  Thoughts of Suicide:  No Will you contract for safety?   NA  Current AVH:  No  Plan for Discharge/Comments:  [name] states he is here because he smoked too much weed over the weekend, and that things have gotten stressful.  Unable to give any more clarity.  States he was a client at [facility] in the past, but no more.  Open to returning there.  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  Lives with mother and brother, and will return there.  Transportation Means:   Supports:  [name] B

## 2012-04-28 NOTE — H&P (Signed)
Psychiatric Admission Assessment Adult  Patient Identification:  [name] Date of Evaluation:  04/28/2012  Chief Complaint:  "I ran out of my medications and was smoking marijuana"  History of Present Illness::Patient  is a 32 year old male with history of Bipolar disorder who presents with mood swings, agitation, depression, worries, anxiety and paranoia. Patient reports that he ran out of his olanzapine about 2 months ago and have been self medicating with Marijuana. He states that he was diagnosed with Bipolar at the age of 83 and has had multiple inpatient admissions to [facility], [facility] and [facility]. He works for [employer], single, has no children and denies current legal problem.   Elements:  Location:  [facility] inpatient. Associated Signs/Synptoms: Depression Symptoms:  depressed mood, psychomotor agitation, difficulty concentrating, recurrent thoughts of death, disturbed sleep, (Hypo) Manic Symptoms:  Delusions, Distractibility, Flight of Ideas, Impulsivity, Irritable Mood, Anxiety Symptoms:  Excessive Worry, Psychotic Symptoms:  Delusions, PTSD Symptoms: NA  Psychiatric Specialty Exam: Physical Exam  Psychiatric: His speech is normal. His mood appears anxious. He is agitated. Thought content is paranoid. Cognition and memory are normal. He expresses impulsivity and inappropriate judgment. He exhibits a depressed mood.    Review of Systems  Constitutional: Negative.   HENT: Negative.   Eyes: Negative.   Respiratory: Negative.   Cardiovascular: Negative.   Gastrointestinal: Negative.   Genitourinary: Negative.   Musculoskeletal: Negative.   Skin: Negative.   Neurological: Negative.   Endo/Heme/Allergies: Negative.   Psychiatric/Behavioral: Positive for depression. The patient is nervous/anxious and has insomnia.     Blood pressure 138/88, pulse 93, temperature 97.6 F (36.4 C), temperature source Oral, resp. rate 14, height 0' (0 m),  weight 0 kg (0 lb), SpO2 99.00%.Cannot calculate BMI with a height equal to zero.  General Appearance: Fairly Groomed  Patent attorney::  Minimal  Speech:  Pressured and Slow  Volume:  Decreased  Mood:  Anxious  Affect:  Blunt  Thought Process:  Disorganized  Orientation:  Full (Time, Place, and Person)  Thought Content:  Delusions and Paranoid Ideation  Suicidal Thoughts:  No  Homicidal Thoughts:  No  Memory:  Immediate;   Fair Recent;   Fair Remote;   Fair  Judgement:  Poor  Insight:  Lacking  Psychomotor Activity:  Decreased  Concentration:  Poor  Recall:  Fair  Akathisia:  No  Handed:  Right  AIMS (if indicated):     Assets:  Communication Skills Desire for Improvement Social Support  Sleep:  Number of Hours: 6.75    Past Psychiatric History: Diagnosis:  Hospitalizations:  Outpatient Care:  Substance Abuse Care:  Self-Mutilation:  Suicidal Attempts:  Violent Behaviors:   Past Medical History:   Past Medical History  Diagnosis Date  . Bipolar 1 disorder   . Hypertension     Allergies:  No Known Allergies PTA Medications: Prescriptions prior to admission  Medication Sig Dispense Refill  . cloNIDine (CATAPRES) 0.1 MG tablet Take 0.1 mg by mouth 3 (three) times daily.      Marland Kitchen lamoTRIgine (LAMICTAL) 100 MG tablet Take 100 mg by mouth 2 (two) times daily.       Marland Kitchen OLANZapine (ZYPREXA) 15 MG tablet Take 15 mg by mouth at bedtime.          Previous Psychotropic Medications:  Medication/Dose                 Substance Abuse History in the last 12 months:  yes  Consequences of Substance Abuse: Negative  Social History:  reports that he has been smoking Cigarettes.  He has a 10 pack-year smoking history. He does not have any smokeless tobacco history on file. He reports that he uses illicit drugs (Marijuana). He reports that he does not drink alcohol. Additional Social History: Prescriptions: see PTA History of alcohol / drug use?: Yes Negative  Consequences of Use: Financial;Personal relationships;Legal;Work / School                    Current Place of Residence:   Place of Birth:   Family Members: Marital Status:  Single Children:  Sons:  Daughters: Relationships: Education:  [employer] Problems/Performance: Religious Beliefs/Practices: History of Abuse (Emotional/Phsycial/Sexual) Occupational Experiences; Military History:  None. Legal History: Hobbies/Interests:  Family History:  History reviewed. No pertinent family history.  Results for orders placed during the hospital encounter of 04/27/12 (from the past 72 hour(s))  COMPREHENSIVE METABOLIC PANEL     Status: Abnormal   Collection Time    04/28/12  6:38 AM      Result Value Range   Sodium 140  135 - 145 mEq/L   Potassium 3.8  3.5 - 5.1 mEq/L   Chloride 102  96 - 112 mEq/L   CO2 28  19 - 32 mEq/L   Glucose, Bld 103 (*) 70 - 99 mg/dL   BUN 8  6 - 23 mg/dL   Creatinine, Ser 7.82  0.50 - 1.35 mg/dL   Calcium 9.8  8.4 - 95.6 mg/dL   Total Protein 7.0  6.0 - 8.3 g/dL   Albumin 3.9  3.5 - 5.2 g/dL   AST 22  0 - 37 U/L   ALT 15  0 - 53 U/L   Alkaline Phosphatase 76  39 - 117 U/L   Total Bilirubin 0.6  0.3 - 1.2 mg/dL   GFR calc non Af Amer >90  >90 mL/min   GFR calc Af Amer >90  >90 mL/min   Comment:            The eGFR has been calculated     using the CKD EPI equation.     This calculation has not been     validated in all clinical     situations.     eGFR's persistently     <90 mL/min signify     possible Chronic Kidney Disease.  MAGNESIUM     Status: None   Collection Time    04/28/12  6:38 AM      Result Value Range   Magnesium 2.1  1.5 - 2.5 mg/dL   Psychological Evaluations:  Assessment:   AXIS I:  Bipolar, Depressed. Cannabis abuse AXIS II:  Deferred AXIS III:   Past Medical History  Diagnosis Date  . Bipolar 1 disorder   . Hypertension    AXIS IV:  other psychosocial or environmental problems and problems  related to social environment AXIS V:  21-30 behavior considerably influenced by delusions or hallucinations OR serious impairment in judgment, communication OR inability to function in almost all areas  Treatment Plan/Recommendations:  1. Admit for crisis management and stabilization. 2. Medication management to reduce current symptoms to base line and improve the     patient's overall level of functioning 3. Treat health problems as indicated. 4. Develop treatment plan to decrease risk of relapse upon discharge and the need for     readmission. 5. Psycho-social education regarding relapse prevention and self care. 6. Health  care follow up as needed for medical problems. 7. Restart home medications where appropriate.   Treatment Plan Summary: Daily contact with patient to assess and evaluate symptoms and progress in treatment Medication management Current Medications:  Current Facility-Administered Medications  Medication Dose Route Frequency Provider Last Rate Last Dose  . acetaminophen (TYLENOL) tablet 650 mg  650 mg Oral Q6H PRN Kerry Hough, PA-C      . alum & mag hydroxide-simeth (MAALOX/MYLANTA) 200-200-20 MG/5ML suspension 30 mL  30 mL Oral Q4H PRN Kerry Hough, PA-C      . cloNIDine (CATAPRES) tablet 0.1 mg  0.1 mg Oral TID Kerry Hough, PA-C   0.1 mg at 04/28/12 0843  . lamoTRIgine (LAMICTAL) tablet 100 mg  100 mg Oral BID Kerry Hough, PA-C   100 mg at 04/28/12 0843  . magnesium hydroxide (MILK OF MAGNESIA) suspension 30 mL  30 mL Oral Daily PRN Kerry Hough, PA-C      . nicotine (NICODERM CQ - dosed in mg/24 hours) patch 14 mg  14 mg Transdermal Q0600 Kerry Hough, PA-C   14 mg at 04/28/12 0618  . OLANZapine (ZYPREXA) tablet 15 mg  15 mg Oral QHS Kerry Hough, PA-C   15 mg at 04/27/12 2126  . potassium chloride SA (K-DUR,KLOR-CON) CR tablet 20 mEq  20 mEq Oral BID Kerry Hough, PA-C   20 mEq at 04/28/12 4098  . traZODone (DESYREL) tablet 50 mg  50 mg Oral  QHS,MR X 1 Kerry Hough, PA-C        Observation Level/Precautions:  routine  Laboratory:  routine  Psychotherapy:    Medications:    Consultations:    Discharge Concerns:    Estimated LOS:  Other:     I certify that inpatient services furnished can reasonably be expected to improve the patient's condition.   Brennon Otterness.MD 4/15/201410:59 AM

## 2012-04-28 NOTE — Progress Notes (Signed)
Pt refused 1700 dose of clonidine. Per pt, he may request for it tonight.

## 2012-04-28 NOTE — Tx Team (Signed)
  Interdisciplinary Treatment Plan Update   Date Reviewed:  04/28/2012  Time Reviewed:  8:37 AM  Progress in Treatment:   Attending groups: Yes Participating in groups: Minimally Taking medication as prescribed: Yes  Tolerating medication: Yes Family/Significant other contact made: No  Patient understands diagnosis: Yes As evidenced by asking for help with mood stabilization and paranoid thinking Discussing patient identified problems/goals with staff: Yes  See initial plan Medical problems stabilized or resolved: Yes Denies suicidal/homicidal ideation: Yes In tx team Patient has not harmed self or others: Yes  For review of initial/current patient goals, please see plan of care.  Estimated Length of Stay:  3-4 days  Reason for Continuation of Hospitalization: Anxiety Depression Medication stabilization  New Problems/Goals identified:  N/A  Discharge Plan or Barriers:   return home, follow up outpt  Additional Comments: "I ran out of my medications and was smoking marijuana"  History of Present Illness::Patient is a 32 year old male with history of Bipolar disorder who presents with mood swings, agitation, depression, worries, anxiety and paranoia. Patient reports that he ran out of his olanzapine about 2 months ago and have been self medicating with Marijuana. He states that he was diagnosed with Bipolar at the age of 29 and has had multiple inpatient admissions to [facility], [facility] and [facility]. He works for [employer], single, has no children and denies current legal problem.    Attendees:  Signature: Thedore Mins, MD 04/28/2012 8:37 AM   Signature: Richelle Ito, LCSW 04/28/2012 8:37 AM  Signature: Verne Spurr, PA 04/28/2012 8:37 AM  Signature:  04/28/2012 8:37 AM  Signature: Liborio Nixon, RN 04/28/2012 8:37 AM  Signature:  04/28/2012 8:37 AM  Signature:   04/28/2012 8:37 AM  Signature:    Signature:    Signature:    Signature:    Signature:     Signature:      Scribe for Treatment Team:   Richelle Ito, LCSW  04/28/2012 8:37 AM

## 2012-04-28 NOTE — BHH Suicide Risk Assessment (Signed)
Suicide Risk Assessment  Admission Assessment     Nursing information obtained from:  Patient Demographic factors:  Male;Low socioeconomic status Current Mental Status:  NA Loss Factors:  Financial problems / change in socioeconomic status Historical Factors:  Prior suicide attempts Risk Reduction Factors:  Sense of responsibility to family;Living with another person, especially a relative  CLINICAL FACTORS:   Bipolar Disorder:   Depressive phase Alcohol/Substance Abuse/Dependencies Previous Psychiatric Diagnoses and Treatments  COGNITIVE FEATURES THAT CONTRIBUTE TO RISK:  Closed-mindedness Polarized thinking    SUICIDE RISK:   Minimal: No identifiable suicidal ideation.  Patients presenting with no risk factors but with morbid ruminations; may be classified as minimal risk based on the severity of the depressive symptoms  PLAN OF CARE:1. Admit for crisis management and stabilization. 2. Medication management to reduce current symptoms to base line and improve the     patient's overall level of functioning 3. Treat health problems as indicated. 4. Develop treatment plan to decrease risk of relapse upon discharge and the need for     readmission. 5. Psycho-social education regarding relapse prevention and self care. 6. Health care follow up as needed for medical problems. 7. Restart home medications where appropriate.   I certify that inpatient services furnished can reasonably be expected to improve the patient's condition.  Gavriella Hearst,MD 04/28/2012, 10:58 AM

## 2012-04-29 DIAGNOSIS — F3130 Bipolar disorder, current episode depressed, mild or moderate severity, unspecified: Principal | ICD-10-CM

## 2012-04-29 MED ORDER — TETRAHYDROZOLINE HCL 0.05 % OP SOLN
1.0000 [drp] | OPHTHALMIC | Status: DC | PRN
  Administered 2012-04-29 – 2012-05-01 (×3): 1 [drp] via OPHTHALMIC
  Filled 2012-04-29: qty 15

## 2012-04-29 MED ORDER — OLANZAPINE 10 MG PO TABS
10.0000 mg | ORAL_TABLET | Freq: Every day | ORAL | Status: DC
  Administered 2012-04-29 – 2012-04-30 (×2): 10 mg via ORAL
  Filled 2012-04-29 (×4): qty 1
  Filled 2012-04-29: qty 3

## 2012-04-29 MED ORDER — NAPHAZOLINE-GLYCERIN 0.012-0.2 % OP SOLN: 1.0000 [drp] | OPHTHALMIC | Status: DC | PRN

## 2012-04-29 NOTE — Progress Notes (Signed)
[facility] MD Progress Note  04/29/2012 4:21 PM [name]  MRN:  [id number] Subjective:  "That man walked into my room, and it's got me pretty upset." Objective: [name] is fixated on another patient walking into his room and can not seem to let go of this despite reassurances from the staff. It happened 2 nights ago and has not happened since then. He is anxious and extremely paranoid regarding this, and feels that he needs to be moved to another hall. He is asking for discharge tomorrow as he was told in the ED that he would only be here 2 days. Diagnosis:  Bipolar I disorder, most recent episode depressed   ADL's:  Intact  Sleep: Fair  Appetite:  Fair  Suicidal Ideation:  denies Homicidal Ideation:  denies AEB (as evidenced by): by the patient's report.  Psychiatric Specialty Exam: Review of Systems  Constitutional: Negative.  Negative for fever, chills, weight loss, malaise/fatigue and diaphoresis.  HENT: Negative for congestion and sore throat.   Eyes: Negative for blurred vision, double vision and photophobia.  Respiratory: Negative for cough, shortness of breath and wheezing.   Cardiovascular: Negative for chest pain, palpitations and PND.  Gastrointestinal: Negative for heartburn, nausea, vomiting, abdominal pain, diarrhea and constipation.  Musculoskeletal: Negative for myalgias, joint pain and falls.  Neurological: Negative for dizziness, tingling, tremors, sensory change, speech change, focal weakness, seizures, loss of consciousness, weakness and headaches.  Endo/Heme/Allergies: Negative for polydipsia. Does not bruise/bleed easily.  Psychiatric/Behavioral: Negative for depression, suicidal ideas, hallucinations, memory loss and substance abuse. The patient is not nervous/anxious and does not have insomnia.     Blood pressure 151/92, pulse 80, temperature 98.9 F (37.2 C), temperature source Oral, resp. rate 18, height 0' (0 m), weight 0 kg (0 lb), SpO2 99.00%.Cannot  calculate BMI with a height equal to zero.  General Appearance: Casual  Eye Contact::  Fair  Speech:  Pressured  Volume:  Normal  Mood:  Anxious  Affect:  Labile  Thought Process:  Linear  Orientation:  Full (Time, Place, and Person)  Thought Content:  Rumination  Suicidal Thoughts:  No  Homicidal Thoughts:  No  Memory:  Immediate;   Poor  Judgement:  Impaired  Insight:  Lacking  Psychomotor Activity:  Increased  Concentration:  Poor  Recall:  Poor  Akathisia:  No  Handed:  Right  AIMS (if indicated):     Assets:  Communication Skills Desire for Improvement  Sleep:  Number of Hours: 6   Current Medications: Current Facility-Administered Medications  Medication Dose Route Frequency Provider Last Rate Last Dose  . acetaminophen (TYLENOL) tablet 650 mg  650 mg Oral Q6H PRN Kerry Hough, PA-C   650 mg at 04/29/12 0402  . alum & mag hydroxide-simeth (MAALOX/MYLANTA) 200-200-20 MG/5ML suspension 30 mL  30 mL Oral Q4H PRN Kerry Hough, PA-C      . cloNIDine (CATAPRES) tablet 0.1 mg  0.1 mg Oral TID Kerry Hough, PA-C   0.1 mg at 04/29/12 1255  . lamoTRIgine (LAMICTAL) tablet 50 mg  50 mg Oral BID Mojeed Akintayo   50 mg at 04/29/12 0804  . magnesium hydroxide (MILK OF MAGNESIA) suspension 30 mL  30 mL Oral Daily PRN Kerry Hough, PA-C      . nicotine (NICODERM CQ - dosed in mg/24 hours) patch 14 mg  14 mg Transdermal Q0600 Kerry Hough, PA-C   14 mg at 04/29/12 4098  . OLANZapine (ZYPREXA) tablet 5 mg  5 mg  Oral QHS Mojeed Akintayo   5 mg at 04/28/12 2129  . potassium chloride SA (K-DUR,KLOR-CON) CR tablet 20 mEq  20 mEq Oral BID Kerry Hough, PA-C   20 mEq at 04/29/12 0804  . tetrahydrozoline 0.05 % ophthalmic solution 1 drop  1 drop Both Eyes Q3H PRN Mojeed Akintayo      . traZODone (DESYREL) tablet 50 mg  50 mg Oral QHS,MR X 1 Kerry Hough, PA-C   50 mg at 04/28/12 2129    Lab Results:  Results for orders placed during the hospital encounter of 04/27/12 (from  the past 48 hour(s))  COMPREHENSIVE METABOLIC PANEL     Status: Abnormal   Collection Time    04/28/12  6:38 AM      Result Value Range   Sodium 140  135 - 145 mEq/L   Potassium 3.8  3.5 - 5.1 mEq/L   Chloride 102  96 - 112 mEq/L   CO2 28  19 - 32 mEq/L   Glucose, Bld 103 (*) 70 - 99 mg/dL   BUN 8  6 - 23 mg/dL   Creatinine, Ser 1.61  0.50 - 1.35 mg/dL   Calcium 9.8  8.4 - 09.6 mg/dL   Total Protein 7.0  6.0 - 8.3 g/dL   Albumin 3.9  3.5 - 5.2 g/dL   AST 22  0 - 37 U/L   ALT 15  0 - 53 U/L   Alkaline Phosphatase 76  39 - 117 U/L   Total Bilirubin 0.6  0.3 - 1.2 mg/dL   GFR calc non Af Amer >90  >90 mL/min   GFR calc Af Amer >90  >90 mL/min   Comment:            The eGFR has been calculated     using the CKD EPI equation.     This calculation has not been     validated in all clinical     situations.     eGFR's persistently     <90 mL/min signify     possible Chronic Kidney Disease.  MAGNESIUM     Status: None   Collection Time    04/28/12  6:38 AM      Result Value Range   Magnesium 2.1  1.5 - 2.5 mg/dL    Physical Findings: AIMS:  , ,  ,  ,    CIWA:    COWS:     Treatment Plan Summary: Daily contact with patient to assess and evaluate symptoms and progress in treatment Medication management  Plan: 1. Continue crisis management and stabilization. 2. Medication management to reduce current symptoms to base line and improve patient's overall level of functioning 3. Treat health problems as indicated. 4. Develop treatment plan to decrease risk of relapse upon discharge and the need for     readmission. 5. Psycho-social education regarding relapse prevention and self care. 6. Health care follow up as needed for medical problems. 7. Continue home medications where appropriate. 8. Will increase his olanzepine to 10mg  at hs for his anxiety and rumination with paranoia. 9. Will continue all other medications as written.  Medical Decision Making Problem Points:   Established problem, stable/improving (1) and Review of last therapy session (1) Data Points:  Review or order medicine tests (1)  I certify that inpatient services furnished can reasonably be expected to improve the patient's condition.   Rona Ravens. Calvyn Kurtzman RPAC 4:28 PM 04/29/2012

## 2012-04-29 NOTE — [facility] Suicide Risk Assessment (Signed)
[facility] INPATIENT:  Family/Significant Other Suicide Prevention Education  Suicide Prevention Education:  Education Completed; No one has been identified by the patient as the family member/significant other with whom the patient will be residing, and identified as the person(s) who will aid the patient in the event of a mental health crisis (suicidal ideations/suicide attempt).  With written consent from the patient, the family member/significant other has been provided the following suicide prevention education, prior to the and/or following the discharge of the patient.  The suicide prevention education provided includes the following:  Suicide risk factors  Suicide prevention and interventions  National Suicide Hotline telephone number  [facility] assessment telephone number  [facility] Emergency Assistance 911  [facility] and/or Residential Mobile Crisis Unit telephone number  Request made of family/significant other to:  Remove weapons (e.g., guns, rifles, knives), all items previously/currently identified as safety concern.    Remove drugs/medications (over-the-counter, prescriptions, illicit drugs), all items previously/currently identified as a safety concern.  The family member/significant other verbalizes understanding of the suicide prevention education information provided.  The family member/significant other agrees to remove the items of safety concern listed above.Braelin did not endorse SI prior to admission, nor did he while in the hospital.  No SPE required.  Daryel Gerald B 04/29/2012, 3:26 PM

## 2012-04-29 NOTE — Progress Notes (Signed)
Seen and agreed. Charle Mclaurin, MD 

## 2012-04-29 NOTE — Progress Notes (Signed)
D: Pt denies SI/HI/AVH. Pt is fearful and paranoid about someone entering his room. Per pt, he do not feel safe. Pt is afraid of one of the pt's on the unit because the pt entered his room yesterday. Pt is anxious this morning. Pt need redirecting at times. Pt has no insight for treatment.   A: Medications administered as ordered per MD. Verbal support given. Pt encouraged to attend groups. 15 minute checks performed for safety. Shift assessment completed.   R: Pt is anxious and worried.

## 2012-04-29 NOTE — Progress Notes (Signed)
Adult Psychoeducational Group Note  Date:  04/29/2012 Time:  11:50 AM  Group Topic/Focus:  Crisis Planning:   The purpose of this group is to help patients create a crisis plan for use upon discharge or in the future, as needed.  Participation Level:  Did Not Attend  Participation Quality:    Affect:    Cognitive:    Insight:   Engagement in Group:    Modes of Intervention:    Additional Comments:  Pt was in the bed sleeping, refused to attend.  Isla Pence M 04/29/2012, 11:50 AM

## 2012-04-29 NOTE — [facility] Group Notes (Signed)
[facility] LCSW Aftercare Discharge Planning Group Note   04/29/2012 11:09 AM  Participation Quality:  Appropriate   Mood/Affect:  Appropriate  Depression Rating:  Low   Anxiety Rating:  High--confusion and cloudiness reported and believed to be caused by medication (patient report)  Thoughts of Suicide:  No Will you contract for safety?   NA  Current AVH:  No  Plan for Discharge/Comments:  Pt plans to return home to live with his mother and would like to follow up with [facility] but believes that the psychiatrist has a long waiting list. Currently, pt is unwilling to allow SW to make appointments for him. He wants to research all of his options before committing to a specific therapist/psychiatrist.   Transportation Means: family members  Supports: Armed forces logistics/support/administrative officer, [employer]

## 2012-04-29 NOTE — [facility] Group Notes (Signed)
[facility] LCSW Group Therapy  04/29/2012 1:32 PM  Type of Therapy:  Group Therapy  Participation Level:  Minimal  Participation Quality:  Appropriate and Attentive  Affect:  Appropriate  Cognitive:  Oriented  Insight:  Engaged  Engagement in Group:  Limited  Engagement in Therapy:  Limited  Modes of Intervention:  Discussion, Education, Socialization and Support  Summary of Progress/Problems: MHA: Patient was attentive and engaged with speaker from [employer]. He listened quietly as speaker discussed personal experiences with mental illness/SA and reviewed services offered by [facility]. Pt attentively listened as speaker played guitar.  Smart, [name] 04/29/2012, 1:32 PM

## 2012-04-29 NOTE — [facility] Counselor (Signed)
Adult Comprehensive Assessment  Patient ID: [name], male   DOB: 01/18/1980, 32 y.o.   MRN: [id number]  Information Source: Information source: Patient  Current Stressors:  Educational / Learning stressors: none identified Employment / Job issues: Currently employed Family Relationships: Strong-lives with mother. Close to middle brother and father Surveyor, quantity / Lack of resources (include bankruptcy): none identified Housing / Lack of housing: lives with his mother Physical health (include injuries & life threatening diseases): high blood pressure Social relationships: 3 close friends. Rates as "limited" Substance abuse: Quite smoking cannabisw and drinking approximately one month ago  Bereavement / Loss: Grandmother passed away Nov 2013-"That hit me hard." Grandfather sick and in nursing home.   Living/Environment/Situation:  Living Arrangements: Parent Living conditions (as described by patient or guardian): "decent" How long has patient lived in current situation?: 10 years What is atmosphere in current home: Comfortable;Supportive  Family History:  Marital status: Single Does patient have children?: No  Childhood History:  By whom was/is the patient raised?: Mother;Father;Grandparents Additional childhood history information: Parents  divorced when pt was about 32 years old. Grandparents helped out. "I had a normal childhood." Lived with dad for about three years.  Description of patient's relationship with caregiver when they were a child: Good relationship with both parents.  Patient's description of current relationship with people who raised him/her: Close with mom and dad currently. Father lives in [place], [place]. Mother is very emotionally supportive. Does patient have siblings?: Yes Number of Siblings: 2 Description of patient's current relationship with siblings: Youngest brother strained relationship. Second youngest brother: close. They live together with mother.   Did patient suffer any verbal/emotional/physical/sexual abuse as a child?: No Did patient suffer from severe childhood neglect?: No Has patient ever been sexually abused/assaulted/raped as an adolescent or adult?: No Was the patient ever a victim of a crime or a disaster?: No Witnessed domestic violence?: No Has patient been effected by domestic violence as an adult?: No  Education:  Highest grade of school patient has completed: graduated high school.  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  How long has patient been employed?: 8 years. Patient's job has been impacted by current illness: Yes Describe how patient's job has been impacted: unable to function at work. What is the longest time patient has a held a job?: 8 years Where was the patient employed at that time?: 8 years at [employer].  Has patient ever been in the [employer]?: No Has patient ever served in combat?: No  Financial Resources:   Surveyor, quantity resources: Insurance underwriter;Income from employment  Alcohol/Substance Abuse:   What has been your use of drugs/alcohol within the last 12 months?: marijuana every few days until about a month ago when pt quite. Pt drank every few days (48 oz) until about amonth ago.  If attempted suicide, did drugs/alcohol play a role in this?: Yes (took pills at 25. ) Alcohol/Substance Abuse Treatment Hx: Past Tx, Inpatient If yes, describe treatment: [facility], [facility].  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  Type of faith/religion: Christianity How does patient's faith help to cope with current illness?: "It just does, I don't know how."  Leisure/Recreation:   Leisure  and Hobbies: Plan to go to the gym; listen to music  Strengths/Needs:   What things does the patient do well?: "Just my personality really." Good people skills In what areas does patient struggle / problems for patient: Side effects from meds, comprehension/cluttered thinking.   Discharge Plan:   Does patient have access to transportation?: Yes (mom and brother drive him. No license due to DUI.) Will patient be returning to same living situation after discharge?: Yes (Returning home. ) Currently receiving community mental health services: Yes (From Whom) ([facility]--seeing a Freight forwarder. ) If no, would patient like referral for services when discharged?: Yes (What [place]?) [facility] Does patient have financial barriers related to discharge medications?: No  Summary/Recommendations:   Summary and Recommendations (to be completed by the evaluator): Pt is 32 year old male living in [place], [place] with his mother. He was admitted to the hospital due to psychotic behavior reported by his mother due to medication non compliance for an unknown period of time. Pt reports that he quit smoking cannibis and drinking alcohol approximately one month ago. He also reports being at [facility] at least 2 other times in the past. Pt has history of bipolar disorder. Recommendations for pt include thereaputic milieu, encourage group attendance and participation, medication maagement for elimination of psychosis and mood stabilization, and development of comprehensive mental wellness plan. Pt currently sees NP at [facility] for medication but would like a psychiatrist and counselor.   [name], [name]. 04/29/2012

## 2012-04-29 NOTE — Progress Notes (Signed)
Recreation Therapy Notes  Date: 04.16.2014  Time: 9:30am  Location: [address] Day Room   Group Topic/Focus: Decision Making   Participation Level:  Minimal  Participation Quality:  Appropriate, Sharing   Affect:  Flat   Cognitive:  Appropriate   Additional Comments: Activity:Would you Rather & If; Explanation: Would you Rather - Patients were individually asked an either/or question (i.e.: Would you rather eat a blue M&M or a red M&M). Patient's were asked to explain why they chose the option they did. If - Patients were individually asked a "If you could... " (i.e. If you could work anywhere in the world where would you work?) question. Patient answered the questions and explained why they chose the answer they did.   Patient arrived to group session during the last five minutes. Patient was able to participate in If. When asked "If you could learn any skill, what would it be?" Patient stated that he would like to learn how to write, because he likes to write. Patient listened to group discussion about decision making and what influences the decisions we make.    Marykay Lex Genevieve Arbaugh, LRT/CTRS  Itzayanna Kaster L 04/29/2012 1:14 PM

## 2012-04-29 NOTE — Progress Notes (Signed)
D: Patient in his room on approach.  Patient states he had a decent day.  Patient remains paranoid but states it is getting better as long as he takes his medications.  Patient states he has learned that he has to stay on his medications.  Patient denies SI/HI and denies AVH. A: Staff to monitor Q 15 mins for safety.  Encouragement and support offered.  Scheduled medications administered per orders. R: Patient remains safe on the unit.  PAtient attended group tonight.  Patient calm, cooperative ant taking adminisitered medications.

## 2012-04-30 MED ORDER — BUSPIRONE HCL 10 MG PO TABS
10.0000 mg | ORAL_TABLET | Freq: Two times a day (BID) | ORAL | Status: DC
  Administered 2012-04-30 – 2012-05-01 (×2): 10 mg via ORAL
  Filled 2012-04-30: qty 6
  Filled 2012-04-30: qty 1
  Filled 2012-04-30: qty 6
  Filled 2012-04-30 (×3): qty 1

## 2012-04-30 NOTE — [facility] Group Notes (Signed)
[facility] LCSW Group Therapy  04/30/2012 3:41 PM   Type of Therapy:  Group Therapy  Participation Level:  Minimal  Participation Quality:  Drowsy  Affect:  Lethargic  Cognitive:  Appropriate  Insight:  Improving  Engagement in Group:  Improving  Engagement in Therapy:  Improving  Modes of Intervention:  Discussion, Education, Exploration, Socialization and Support  Summary of Progress/Problems: Topic-Balance: The topic for group was balance in life. [name] attempted to stay awake throughout group but reported feeling very sleepy--possibly due to medication changes. He rested with eyes closed throughout majority of group and did not contribute to group processing.   Smart, [name] 04/30/2012, 3:41 PM

## 2012-04-30 NOTE — [facility] Group Notes (Signed)
[facility] LCSW Aftercare Discharge Planning Group Note   04/30/2012 11:10 AM  Participation Quality:  Engaged  Mood/Affect:  Flat  Depression Rating:  unknown  Anxiety Rating:  unknown  Thoughts of Suicide:  No Will you contract for safety?   NA  Current AVH:  No  Plan for Discharge/Comments:  Today [name] says it is OK for [place] to set up follow up with [facility]  He talked about needing to stop smoking weed.  Transportation Means: family  Supports: family  [name], [name]

## 2012-04-30 NOTE — Progress Notes (Signed)
D: Patient on the dayroom an appeared to be sleeping but patient woke up.  Patient states he had a good day.  Patient states he did the same things as yesterday but could not remember what they were.  Patient states he has been taking his medications and patient states he is feeling better.  Patient denies SI/HI and denies AVH. A: Staff to monitor Q 15 mins for safety.  Encouragement and support offered.  Scheduled medications administered per orders. R: Patient remains safe on the unit.  Patient attended karaoke group.  Patient taking administered medications.  Patient visible on the unit and interacting with peers.

## 2012-04-30 NOTE — Progress Notes (Signed)
Patient ID: [name], male   DOB: 1980-02-21, 32 y.o.   MRN: [id number] [facility] MD Progress Note  04/30/2012 3:18 PM [name]  MRN:  [id number] Subjective: "I'm much better."  Objective: Patient calmer today, asking about anxiety medication, reports he has had dry eyes. States he has minimal depression, anxiety is a 4/10, but states he has tremors as well. None are visible. He has good eye contact, appears less anxious, speech is clear and goal directed, thought process is linear and direct. Judgement is improving, insight is present into his use of marijuana.   Diagnosis:  Bipolar I disorder, most recent episode depressed   ADL's:  Intact  Sleep: good  Appetite:  Fair  Suicidal Ideation:  denies Homicidal Ideation:  denies AEB (as evidenced by): by the patient's report.  Psychiatric Specialty Exam: Review of Systems  Constitutional: Negative.  Negative for fever, chills, weight loss, malaise/fatigue and diaphoresis.  HENT: Negative for congestion and sore throat.   Eyes: Negative for blurred vision, double vision and photophobia.  Respiratory: Negative for cough, shortness of breath and wheezing.   Cardiovascular: Negative for chest pain, palpitations and PND.  Gastrointestinal: Negative for heartburn, nausea, vomiting, abdominal pain, diarrhea and constipation.  Musculoskeletal: Negative for myalgias, joint pain and falls.  Neurological: Negative for dizziness, tingling, tremors, sensory change, speech change, focal weakness, seizures, loss of consciousness, weakness and headaches.  Endo/Heme/Allergies: Negative for polydipsia. Does not bruise/bleed easily.  Psychiatric/Behavioral: Negative for depression, suicidal ideas, hallucinations, memory loss and substance abuse. The patient is not nervous/anxious and does not have insomnia.     Blood pressure 115/74, pulse 79, temperature 97.5 F (36.4 C), temperature source Oral, resp. rate 18, height 0' (0 m), weight 0 kg (0  lb), SpO2 99.00%.Cannot calculate BMI with a height equal to zero.  General Appearance: Casual  Eye Contact:: good  Speech:  normal  Volume:  Normal  Mood:  Anxious  Affect:  congruent  Thought Process:  Linear  Orientation:  Full (Time, Place, and Person)  Thought Content:  normal  Suicidal Thoughts:  No  Homicidal Thoughts:  No  Memory:  Immediate;   Poor  Judgement:  improving  Insight:  improving  Psychomotor Activity:  Increased  Concentration:  Poor  Recall:  Poor  Akathisia:  No  Handed:  Right  AIMS (if indicated):     Assets:  Communication Skills Desire for Improvement  Sleep:  Number of Hours: 6.75   Current Medications: Current Facility-Administered Medications  Medication Dose Route Frequency Provider Last Rate Last Dose  . acetaminophen (TYLENOL) tablet 650 mg  650 mg Oral Q6H PRN Kerry Hough, PA-C   650 mg at 04/29/12 0402  . alum & mag hydroxide-simeth (MAALOX/MYLANTA) 200-200-20 MG/5ML suspension 30 mL  30 mL Oral Q4H PRN Kerry Hough, PA-C      . cloNIDine (CATAPRES) tablet 0.1 mg  0.1 mg Oral TID Kerry Hough, PA-C   0.1 mg at 04/29/12 1255  . lamoTRIgine (LAMICTAL) tablet 50 mg  50 mg Oral BID Mojeed Akintayo   50 mg at 04/29/12 0804  . magnesium hydroxide (MILK OF MAGNESIA) suspension 30 mL  30 mL Oral Daily PRN Kerry Hough, PA-C      . nicotine (NICODERM CQ - dosed in mg/24 hours) patch 14 mg  14 mg Transdermal Q0600 Kerry Hough, PA-C   14 mg at 04/29/12 8413  . OLANZapine (ZYPREXA) tablet 5 mg  5 mg Oral QHS Mojeed Akintayo  5 mg at 04/28/12 2129  . potassium chloride SA (K-DUR,KLOR-CON) CR tablet 20 mEq  20 mEq Oral BID Kerry Hough, PA-C   20 mEq at 04/29/12 0804  . tetrahydrozoline 0.05 % ophthalmic solution 1 drop  1 drop Both Eyes Q3H PRN Mojeed Akintayo      . traZODone (DESYREL) tablet 50 mg  50 mg Oral QHS,MR X 1 Kerry Hough, PA-C   50 mg at 04/28/12 2129    Lab Results:  No results found for this or any previous visit  (from the past 48 hour(s)).  Physical Findings: AIMS:  , ,  ,  ,    CIWA:    COWS:     Treatment Plan Summary: Daily contact with patient to assess and evaluate symptoms and progress in treatment Medication management  Plan: 1. Continue crisis management and stabilization. 2. Medication management to reduce current symptoms to base line and improve patient's overall level of functioning 3. Treat health problems as indicated. 4. Develop treatment plan to decrease risk of relapse upon discharge and the need for     readmission. 5. Psycho-social education regarding relapse prevention and self care. 6. Health care follow up as needed for medical problems. 7. Continue home medications where appropriate. 8. Will continue his olanzepine to 10mg  at hs for his anxiety and rumination with paranoia. 9. Will continue all other medications as written. 10. Will start Buspar 10mg  po BID for anxiety. 11. ELOS: 1-2 days. Medical Decision Making Problem Points:  Established problem, stable/improving (1) and Review of last therapy session (1) Data Points:  Review or order medicine tests (1)  I certify that inpatient services furnished can reasonably be expected to improve the patient's condition.   Rona Ravens. Safiyah Cisney RPAC 3:18 PM 04/30/2012

## 2012-04-30 NOTE — Progress Notes (Signed)
Patient ID: [name], male   DOB: 08-04-1980, 32 y.o.   MRN: [id number] Patient has been pleasant and cooperative.  He remains somewhat paranoid and is frightened by some of the behaviors of the other patients.  He is medication compliant.  He denies any SI/HI/AVH.  He denies any depression.  Patient's thought processes are clear; speech logical/coherent.    Continue to monitor medication management and MD orders.  Safety checks continued every 15 minutes per protocol.  Patient's behavior has been appropriate.

## 2012-05-01 MED ORDER — CLONIDINE HCL 0.3 MG PO TABS: ORAL_TABLET | ORAL | Status: DC

## 2012-05-01 MED ORDER — CLONIDINE HCL 0.1 MG PO TABS
0.3000 mg | ORAL_TABLET | Freq: Every day | ORAL | Status: DC
  Filled 2012-05-01 (×2): qty 3
  Filled 2012-05-01: qty 9

## 2012-05-01 MED ORDER — OLANZAPINE 10 MG PO TABS: 10.0000 mg | ORAL_TABLET | Freq: Every day | ORAL | Status: DC

## 2012-05-01 MED ORDER — CLONIDINE HCL 0.3 MG PO TABS: 0.3000 mg | ORAL_TABLET | Freq: Every day | ORAL | Status: DC

## 2012-05-01 MED ORDER — CLONIDINE HCL 0.1 MG PO TABS: ORAL_TABLET | ORAL | Status: DC

## 2012-05-01 MED ORDER — LAMOTRIGINE 25 MG PO TABS: 50.0000 mg | ORAL_TABLET | Freq: Two times a day (BID) | ORAL | Status: DC

## 2012-05-01 MED ORDER — BUSPIRONE HCL 10 MG PO TABS: 10.0000 mg | ORAL_TABLET | Freq: Two times a day (BID) | ORAL | Status: DC

## 2012-05-01 MED ORDER — TRAZODONE HCL 50 MG PO TABS: 50.0000 mg | ORAL_TABLET | Freq: Every evening | ORAL | Status: DC | PRN

## 2012-05-01 NOTE — Progress Notes (Signed)
Recreation Therapy Notes   Date: 04.18.2014 Time: 9:30am Location: [address] Day Room      Group Topic/Focus: Memory, Cognitive Training  Participation Level: Active  Participation Quality: Appropriate  Affect: Flat  Cognitive: Oriented   Additional Comments: Activity: Word Link & Memory Sequence Explanation: Word Link - Patients were asked to link words together (i.e. Red Tomato Soup). Patients were asked to link together as many words as possible. Memory Sequence - Patients were asked to create a sequence of actions for each member of the group to complete. The entire group had to complete the sequence before a new action as added on. Each patient got a turn to add to the sequence of actions. Patients participated from a seated position.   Patient actively participated in both activities. 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 Group was unable to process the memory sequence game so it was stopped after the 4th patient. Patient needed minimal assistance completing the four action sequence created. Patient participated in wrap up discussion about the importance of exercising our brains and forming a routine.   Marykay Lex Kaytee Taliercio, LRT/CTRS   Taja Pentland L 05/01/2012 11:46 AM

## 2012-05-01 NOTE — Discharge Summary (Signed)
Physician Discharge Summary Note  Patient:  [name] is an 32 y.o., male MRN:  [id number] DOB:  08/22/1980 Patient phone:  [phone] (home)  Patient address:   [address] [place] [address] [place] [address],   Date of Admission:  04/27/2012 Date of Discharge: 04/30/2012  Reason for Admission:  Psychosis and cannabis abuse  Discharge Diagnoses: Principal Problem:   Bipolar I disorder, most recent episode depressed Active Problems:   Cannabis abuse  Review of Systems  Constitutional: Negative.  Negative for fever, chills, weight loss, malaise/fatigue and diaphoresis.  HENT: Negative for congestion and sore throat.   Eyes: Negative for blurred vision, double vision and photophobia.  Respiratory: Negative for cough, shortness of breath and wheezing.   Cardiovascular: Negative for chest pain, palpitations and PND.  Gastrointestinal: Negative for heartburn, nausea, vomiting, abdominal pain, diarrhea and constipation.  Musculoskeletal: Negative for myalgias, joint pain and falls.  Neurological: Negative for dizziness, tingling, tremors, sensory change, speech change, focal weakness, seizures, loss of consciousness, weakness and headaches.  Endo/Heme/Allergies: Negative for polydipsia. Does not bruise/bleed easily.  Psychiatric/Behavioral: Negative for depression, suicidal ideas, hallucinations, memory loss and substance abuse. The patient is not nervous/anxious and does not have insomnia.   Discharge Diagnoses:  AXIS I: Bipolar I disorder, most recent episode depressed  AXIS II: Deferred  AXIS III:  Past Medical History   Diagnosis  Date   .  Bipolar 1 disorder    .  Hypertension     AXIS IV: other psychosocial or environmental problems and problems related to social environment  AXIS V: 61-70 mild symptoms  Level of Care:  OP  Hospital Course:  Jeral was admitted after presenting to the ED reporting increasing paranoia, mood swings, agitation, anxiety and depression  after being of Olanzapine for two months. He had been self medicating with marijuana.  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     He was evaluated and started on Olanzapine 10mg  tablets with Buspar for anxiety, and Lamotrigine for mood stabilization. Carefull consideration was given to keep his medication regimen simple as he often had to work split shifts at work.  He was switched from Clonidine 0.1mg  po TID to Clonidine 0.3 once a day either daytime or night time.      Brady was extremely paranoid but responded well to medication, rest and supportive care. He attended groups and was compliant with his medication.  Johnney was evaluated each day by a clinical provider. By the third day Riggs was feeling well enough to be discharged. He denied auditory and visual hallucinations. He denied SI/HI and was in full contact with reality.  He requested discharge so that he could return to work and was felt stable enough to do so.  Khizar was discharged with plans to follow up noted below.  Consults:  None  Significant Diagnostic Studies:  labs: UDS/UA, CMP CBC diff  Discharge Vitals:   Blood pressure 143/99, pulse 75, temperature 98.5 F (36.9 C), temperature source Oral, resp. rate 16, height 0' (0 m), weight 0 kg (0 lb), SpO2 99.00%. Cannot calculate BMI with a height equal to zero. Lab Results:   No results found for this or any previous visit (from the past 72 hour(s)).  Physical Findings: AIMS:  , ,  ,  ,    CIWA:    COWS:     Psychiatric Specialty Exam: See Psychiatric Specialty Exam and  Suicide Risk Assessment completed by Attending Physician prior to discharge.  Discharge destination:  Home Is patient on multiple antipsychotic therapies at discharge:  No   Has Patient had three or more failed trials of antipsychotic monotherapy by history:  No  Recommended Plan for Multiple Antipsychotic Therapies: No  Applicable   Discharge Orders   Future Orders Complete By Expires     Diet - low sodium heart healthy  As directed     Discharge instructions  As directed     Comments:      Take all of your medications as directed. Be sure to keep all of your follow up appointments.  If you are unable to keep your follow up appointment, call your Doctor's office to let them know, and reschedule.  Make sure that you have enough medication to last until your appointment. Be sure to get plenty of rest. Going to bed at the same time each night will help. Try to avoid sleeping during the day.  Increase your activity as tolerated. Regular exercise will help you to sleep better and improve your mental health. Eating a heart healthy diet is recommended. Try to avoid salty or fried foods. Be sure to avoid all alcohol and illegal drugs.    Increase activity slowly  As directed         Medication List    TAKE these medications     Indication   busPIRone 10 MG tablet  Commonly known as:  BUSPAR  Take 1 tablet (10 mg total) by mouth 2 (two) times daily. For anxiety.   Indication:  Anxiety Disorder     cloNIDine 0.1 MG tablet  Commonly known as:  CATAPRES  Take one tablet each day for hypertension.   Indication:  High Blood Pressure     lamoTRIgine 25 MG tablet  Commonly known as:  LAMICTAL  Take 2 tablets (50 mg total) by mouth 2 (two) times daily. Mood stabilization.   Indication:  Manic-Depression, Depression     OLANZapine 10 MG tablet  Commonly known as:  ZYPREXA  Take 1 tablet (10 mg total) by mouth at bedtime. For psychosis.   Indication:  Manic-Depression, Schizophrenia     traZODone 50 MG tablet  Commonly known as:  DESYREL  Take 1 tablet (50 mg total) by mouth at bedtime and may repeat dose one time if needed. For insomnia.   Indication:  Trouble Sleeping           Follow-up Information   Follow up with [facility] On 05/07/2012. (Thursday at 2:00 with Saul Fordyce)     Contact information:   [address]  [place] [phone]      Follow-up recommendations:   Activities: Resume activity as tolerated. Diet: Heart healthy low sodium diet Tests: Follow up testing will be determined by your out patient provider. Comments:    Total Discharge Time:  Greater than 30 minutes.  Signed: Zian Mohamed 05/01/2012, 10:00 AM

## 2012-05-01 NOTE — Progress Notes (Signed)
[facility] Adult Case Management Discharge Plan :  Will you be returning to the same living situation after discharge: Yes,  home At discharge, do you have transportation home?:Yes,  family Do you have the ability to pay for your medications:Yes,  insurance  Release of information consent forms completed and in the chart;  Patient's signature needed at discharge.  Patient to Follow up at: Follow-up Information   Follow up with [facility] On 05/07/2012. (Thursday at 2:00 with Saul Fordyce)    Contact information:   [address]  [place] [phone]      Patient denies SI/HI:   Yes,  yes    Safety Planning and Suicide Prevention discussed:  Yes,  yes  Daryel Gerald B 05/01/2012, 9:05 AM

## 2012-05-01 NOTE — Progress Notes (Addendum)
Pt is very excited about leaving today. Stated his mom will be coming shortly. Pt stated he is in a good place and will continue his meds as prescribed and exercise and eat healthy. PA made aware that pt feels the clonidine does make him feel as if he has some memory loss. Pt contracts for safety and denies any SI or HI. Pt rates his depression a 1/10 and his hopelessness a 1/10. He denies any auditory or visual hallucination . Pt has been attending groups.pt stated his brother would pick him up today. He denies any SI thoughts and does contract form safety. Lloyd Huger., PA, talked to pt concerning his medicaitons as he did not feel he should continue on the zyprexa. After his discussion with the PA he changed his mind and was in agreement. Pt told the mD that he wanted to make his own [facility],

## 2012-05-01 NOTE — [facility] Suicide Risk Assessment (Signed)
Suicide Risk Assessment  Discharge Assessment     Demographic Factors:  Male  Mental Status Per Nursing Assessment::   On Admission:  NA  Current Mental Status by Physician: patient denies suicidal ideation,intent or plan  Loss Factors: NA  Historical Factors: NA  Risk Reduction Factors:   Employed and Positive social support  Continued Clinical Symptoms:  Resolving depressive symptoms  Cognitive Features That Contribute To Risk:  Closed-mindedness    Suicide Risk:  Minimal: No identifiable suicidal ideation.  Patients presenting with no risk factors but with morbid ruminations; may be classified as minimal risk based on the severity of the depressive symptoms  Discharge Diagnoses:   AXIS I:  Bipolar I disorder, most recent episode depressed  AXIS II:  Deferred AXIS III:   Past Medical History  Diagnosis Date  . Bipolar 1 disorder   . Hypertension    AXIS IV:  other psychosocial or environmental problems and problems related to social environment AXIS V:  61-70 mild symptoms  Plan Of Care/Follow-up recommendations:  Activity:  as tolerated Diet:  healthy Tests:  routine Other:  patient to keep her after care appointment  Is patient on multiple antipsychotic therapies at discharge:  No   Has Patient had three or more failed trials of antipsychotic monotherapy by history:  No  Recommended Plan for Multiple Antipsychotic Therapies: N/A  Hulan Szumski,MD 05/01/2012, 9:54 AM

## 2012-05-01 NOTE — Tx Team (Signed)
  Interdisciplinary Treatment Plan Update   Date Reviewed:  05/01/2012  Time Reviewed:  9:37 AM  Progress in Treatment:   Attending groups: Yes Participating in groups: Yes Taking medication as prescribed: Yes  Tolerating medication: Yes Family/Significant other contact made: No  Patient understands diagnosis: Yes As evidenced by asking for help with mood stabilization and paranoid thinking Discussing patient identified problems/goals with staff: Yes  See initial plan Medical problems stabilized or resolved: Yes Denies suicidal/homicidal ideation: Yes In tx team Patient has not harmed self or others: Yes  For review of initial/current patient goals, please see plan of care.  Estimated Length of Stay:  D/C today  Reason for Continuation of Hospitalization:   New Problems/Goals identified:  N/A  Discharge Plan or Barriers:   return home, follow up outpt  Additional Comments:  History of Present Illness::   Attendees:  Signature: Thedore Mins, MD 05/01/2012 9:37 AM   Signature: Richelle Ito, LCSW 05/01/2012 9:37 AM  Signature: Verne Spurr, PA 05/01/2012 9:37 AM  Signature:  05/01/2012 9:37 AM  Signature:  05/01/2012 9:37 AM  Signature:  05/01/2012 9:37 AM  Signature:   05/01/2012 9:37 AM  Signature:    Signature:    Signature:    Signature:    Signature:    Signature:      Scribe for Treatment Team:   Richelle Ito, LCSW  05/01/2012 9:37 AM

## 2012-05-01 NOTE — Progress Notes (Signed)
D: Pt in bed resting with eyes closed. Respirations even and unlabored. Pt appears to be in no signs of distress at this time. A: Q15min checks remains for this pt. R: Pt remains safe at this time.   

## 2012-05-01 NOTE — Progress Notes (Signed)
Adult Psychoeducational Group Note  Date:  05/01/2012 Time:  2000  Group Topic/Focus:  Karaoke group  Participation Level:  Active  Participation Quality:  Appropriate  Affect:  Appropriate  Cognitive:  Appropriate  Insight: Appropriate  Engagement in Group:  Engaged  Modes of Intervention:  Activity  Additional Comments:  Pt attended karaoke group this evening. Pt participated in group.   Cynthya Yam A 05/01/2012, 1:25 AM

## 2012-05-04 NOTE — Discharge Summary (Signed)
Seen and agreed. Raenette Sakata, MD 

## 2012-05-06 NOTE — Progress Notes (Signed)
Patient Discharge Instructions:  After Visit Summary (AVS):   Faxed to:  05/06/12 Discharge Summary Note:   Faxed to:  05/06/12 Psychiatric Admission Assessment Note:   Faxed to:  05/06/12 Suicide Risk Assessment - Discharge Assessment:   Faxed to:  05/06/12 Faxed/Sent to the Next Level Care provider:  05/06/12 Faxed to [facility] @ [phone]  Jerelene Redden, 05/06/2012, 4:01 PM

## 2012-10-18 ENCOUNTER — Emergency Department (HOSPITAL_COMMUNITY)
Admission: EM | Admit: 2012-10-18 | Discharge: 2012-10-18 | Disposition: A | Discharge status: Home / Self Care | Payer: Self-pay | Attending: Emergency Medicine | Admitting: Emergency Medicine

## 2012-10-18 ENCOUNTER — Encounter (HOSPITAL_COMMUNITY): Payer: Self-pay

## 2012-10-18 DIAGNOSIS — F419 Anxiety disorder, unspecified: Secondary | ICD-10-CM

## 2012-10-18 DIAGNOSIS — F17200 Nicotine dependence, unspecified, uncomplicated: Secondary | ICD-10-CM | POA: Insufficient documentation

## 2012-10-18 DIAGNOSIS — H1131 Conjunctival hemorrhage, right eye: Secondary | ICD-10-CM

## 2012-10-18 DIAGNOSIS — F410 Panic disorder [episodic paroxysmal anxiety] without agoraphobia: Secondary | ICD-10-CM | POA: Insufficient documentation

## 2012-10-18 DIAGNOSIS — Z79899 Other long term (current) drug therapy: Secondary | ICD-10-CM | POA: Insufficient documentation

## 2012-10-18 DIAGNOSIS — H44819 Hemophthalmos, unspecified eye: Secondary | ICD-10-CM | POA: Insufficient documentation

## 2012-10-18 DIAGNOSIS — F319 Bipolar disorder, unspecified: Secondary | ICD-10-CM | POA: Insufficient documentation

## 2012-10-18 DIAGNOSIS — G479 Sleep disorder, unspecified: Secondary | ICD-10-CM | POA: Insufficient documentation

## 2012-10-18 DIAGNOSIS — R0602 Shortness of breath: Secondary | ICD-10-CM | POA: Insufficient documentation

## 2012-10-18 DIAGNOSIS — I10 Essential (primary) hypertension: Secondary | ICD-10-CM | POA: Insufficient documentation

## 2012-10-18 MED ORDER — LORAZEPAM 1 MG PO TABS
1.0000 mg | ORAL_TABLET | Freq: Once | ORAL | Status: AC
  Administered 2012-10-18: 1 mg via ORAL
  Filled 2012-10-18: qty 1

## 2012-10-18 MED ORDER — LORAZEPAM 1 MG PO TABS: 1.0000 mg | ORAL_TABLET | Freq: Three times a day (TID) | ORAL | Status: DC | PRN

## 2012-10-18 NOTE — ED Notes (Signed)
Pt initially states he is worried about his blood pressure being high, admits to increased anxiety.  Pt also points to a red spot in his right eye that he noticed last night. Pt denies pain

## 2012-10-18 NOTE — ED Provider Notes (Signed)
CSN: [id number]     Arrival date & time 10/18/12  0506 History   First MD Initiated Contact with Patient 10/18/12 0559     Chief Complaint  Patient presents with  . Panic Attack   (Consider location/radiation/quality/duration/timing/severity/associated sxs/prior Treatment) The history is provided by the patient.   32 year old male. Presents with concern for blood that he sees on the white part of his right eye. Patient was concerned that this represented a stroke or an aneurysm patient has a history of anxiety history of bipolar disorder. Patient has been followed by behavioral health in the [place] area in the past. But he lost his job recently and has not been able to followup with him. He had been on Zyprexa but he ran out of that medicine. He has history of hypertension still taking his clonidine. Patient denies any suicidal or homicidal thoughts. Patient has been having difficulty sleeping for several days now. Patient is concerned his blood pressure is too high. Patient admits to some mild shortness of breath no headache no chest pain no strokelike symptoms. Patient is looking for evaluation for his high blood pressure and renewal of his behavioral health medications.  Past Medical History  Diagnosis Date  . Bipolar 1 disorder   . Hypertension    Past Surgical History  Procedure Laterality Date  . Colonoscopy      1999   No family history on file. History  Substance Use Topics  . Smoking status: Current Every Day Smoker -- 1.00 packs/day for 10 years    Types: Cigarettes  . Smokeless tobacco: Not on file  . Alcohol Use: No    Review of Systems  Constitutional: Negative for fever.  HENT: Negative for congestion and neck pain.   Eyes: Positive for redness. Negative for visual disturbance.  Respiratory: Negative for shortness of breath.   Cardiovascular: Negative for chest pain.  Gastrointestinal: Negative for nausea, vomiting and abdominal pain.  Genitourinary: Negative  for hematuria.  Musculoskeletal: Negative for back pain.  Skin: Negative for rash.  Neurological: Negative for headaches.  Hematological: Does not bruise/bleed easily.  Psychiatric/Behavioral: Positive for sleep disturbance. Negative for suicidal ideas, hallucinations, behavioral problems, confusion, dysphoric mood and agitation. The patient is nervous/anxious. The patient is not hyperactive.     Allergies  Review of patient's allergies indicates no known allergies.  Home Medications   Current Outpatient Rx  Name  Route  Sig  Dispense  Refill  . busPIRone (BUSPAR) 10 MG tablet   Oral   Take 1 tablet (10 mg total) by mouth 2 (two) times daily. For anxiety.   60 tablet   0   . cloNIDine (CATAPRES) 0.3 MG tablet   Oral   Take 1 tablet (0.3 mg total) by mouth daily.   30 tablet   0   . lamoTRIgine (LAMICTAL) 25 MG tablet   Oral   Take 2 tablets (50 mg total) by mouth 2 (two) times daily. Mood stabilization.   60 tablet   0   . OLANZapine (ZYPREXA) 10 MG tablet   Oral   Take 1 tablet (10 mg total) by mouth at bedtime. For psychosis.   30 tablet   0   . cloNIDine (CATAPRES) 0.3 MG tablet      Take one tablet each day for hypertension.   30 tablet   0   . LORazepam (ATIVAN) 1 MG tablet   Oral   Take 1 tablet (1 mg total) by mouth 3 (three) times daily as needed  for anxiety.   15 tablet   0   . traZODone (DESYREL) 50 MG tablet   Oral   Take 1 tablet (50 mg total) by mouth at bedtime and may repeat dose one time if needed. For insomnia.   30 tablet   0    BP 163/99  Pulse 84  Temp(Src) 98.7 F (37.1 C) (Oral)  Resp 20  Ht 5\' 9"  (1.753 m)  Wt 180 lb (81.647 kg)  BMI 26.57 kg/m2  SpO2 96% Physical Exam  Nursing note and vitals reviewed. Constitutional: He is oriented to person, place, and time. He appears well-developed and well-nourished. No distress.  HENT:  Head: Normocephalic and atraumatic.  Mouth/Throat: Oropharynx is clear and moist.  Eyes: EOM are  normal. Pupils are equal, round, and reactive to light.  Right eye lateral part with scleral hemorrhage. No anterior hemorrhage no hyphema.  Neck: Normal range of motion.  Cardiovascular: Normal rate and regular rhythm.   No murmur heard. Pulmonary/Chest: Effort normal and breath sounds normal.  Abdominal: Soft. Bowel sounds are normal. There is no tenderness.  Musculoskeletal: Normal range of motion.  Neurological: He is alert and oriented to person, place, and time. No cranial nerve deficit. He exhibits normal muscle tone. Coordination normal.  Skin: Skin is warm. No rash noted.  Psychiatric: Judgment and thought content normal.  Patient is not manic does seem a little anxious.    ED Course  Procedures (including critical care time) Labs Review Labs Reviewed - No data to display Imaging Review No results found.  MDM   1. Anxiety   2. Hypertension    Patient with behavioral health history. Currently no suicidal or homicidal ideations. Patient was sent anxiety does have a history of bipolar disorder. Patient has been followed in the past by behavioral health down in the [place]. Patient does still taking his clonidine patient had been on Zyprexa but ran out of it. Patient having difficulty sleeping patient not currently manic blood pressure slightly elevated but not significantly elevated no complicating factors or hypertensive emergency. Patient will be started on Ativan this may help the blood pressure. Patient will start a daily log of his blood pressure readings to see if he needs some adjustment his blood pressure medication. Resource guide provided to help him find a primary care doctor. Patient will follow back up with his behavioral health specialist in [place]. Patient also given referral information today marked for the rocking him [place] [facility] if needed.  The a scleral hemorrhage in the right lateral eye has no evidence of hyphema. Patient does not have  significant severe headache, chest pain or significant shortness of breath or any stroke symptoms.    Shelda Jakes, MD 10/18/12 [phone]

## 2012-10-18 NOTE — ED Notes (Signed)
Patient with no complaints at this time. Respirations even and unlabored. Skin warm/dry. Discharge instructions reviewed with patient at this time. Patient given opportunity to voice concerns/ask questions. Patient discharged at this time and left Emergency Department with steady gait.   

## 2012-10-20 ENCOUNTER — Encounter (HOSPITAL_COMMUNITY): Payer: Self-pay | Admitting: *Deleted

## 2012-10-20 ENCOUNTER — Emergency Department (HOSPITAL_COMMUNITY)
Admission: EM | Admit: 2012-10-20 | Discharge: 2012-10-21 | Disposition: A | Discharge status: Other Acute Inpatient Hospital | Payer: Self-pay | Attending: Psychiatry | Admitting: Psychiatry

## 2012-10-20 DIAGNOSIS — R45851 Suicidal ideations: Secondary | ICD-10-CM | POA: Insufficient documentation

## 2012-10-20 DIAGNOSIS — F22 Delusional disorders: Secondary | ICD-10-CM | POA: Insufficient documentation

## 2012-10-20 DIAGNOSIS — Z79899 Other long term (current) drug therapy: Secondary | ICD-10-CM | POA: Insufficient documentation

## 2012-10-20 DIAGNOSIS — F209 Schizophrenia, unspecified: Secondary | ICD-10-CM | POA: Insufficient documentation

## 2012-10-20 DIAGNOSIS — F17200 Nicotine dependence, unspecified, uncomplicated: Secondary | ICD-10-CM | POA: Insufficient documentation

## 2012-10-20 DIAGNOSIS — F319 Bipolar disorder, unspecified: Secondary | ICD-10-CM | POA: Insufficient documentation

## 2012-10-20 DIAGNOSIS — I10 Essential (primary) hypertension: Secondary | ICD-10-CM | POA: Insufficient documentation

## 2012-10-20 DIAGNOSIS — R443 Hallucinations, unspecified: Secondary | ICD-10-CM | POA: Insufficient documentation

## 2012-10-20 HISTORY — DX: Schizophrenia, unspecified: F20.9

## 2012-10-20 LAB — CBC WITH DIFFERENTIAL/PLATELET
Basophils Absolute: 0 10*3/uL (ref 0.0–0.1)
Basophils Relative: 0 % (ref 0–1)
HCT: 44.4 % (ref 39.0–52.0)
Lymphocytes Relative: 22 % (ref 12–46)
MCHC: 34.9 g/dL (ref 30.0–36.0)
Monocytes Absolute: 1.6 10*3/uL — ABNORMAL HIGH (ref 0.1–1.0)
Neutro Abs: 7.8 10*3/uL — ABNORMAL HIGH (ref 1.7–7.7)
Neutrophils Relative %: 64 % (ref 43–77)
RDW: 14.5 % (ref 11.5–15.5)
WBC: 12.2 10*3/uL — ABNORMAL HIGH (ref 4.0–10.5)

## 2012-10-20 LAB — BASIC METABOLIC PANEL
BUN: 8 mg/dL (ref 6–23)
Chloride: 97 mEq/L (ref 96–112)
Creatinine, Ser: 0.84 mg/dL (ref 0.50–1.35)
GFR calc Af Amer: >90 mL/min (ref >90)
GFR calc non Af Amer: >90 mL/min (ref >90)
Potassium: 2.7 mEq/L — CL (ref 3.5–5.1)

## 2012-10-20 LAB — ETHANOL: Alcohol, Ethyl (B): <11 mg/dL (ref 0–11)

## 2012-10-20 LAB — RAPID URINE DRUG SCREEN, HOSP PERFORMED
Amphetamines: NOT DETECTED
Barbiturates: NOT DETECTED
Cocaine: NOT DETECTED
Opiates: NOT DETECTED
Tetrahydrocannabinol: POSITIVE — AB

## 2012-10-20 MED ORDER — POTASSIUM CHLORIDE CRYS ER 20 MEQ PO TBCR
40.0000 meq | EXTENDED_RELEASE_TABLET | Freq: Once | ORAL | Status: AC
  Administered 2012-10-20: 40 meq via ORAL
  Filled 2012-10-20: qty 2

## 2012-10-20 MED ORDER — ACETAMINOPHEN 325 MG PO TABS: 650.0000 mg | ORAL_TABLET | ORAL | Status: DC | PRN

## 2012-10-20 MED ORDER — TRAZODONE HCL 50 MG PO TABS
50.0000 mg | ORAL_TABLET | Freq: Every evening | ORAL | Status: DC | PRN
  Administered 2012-10-21: 50 mg via ORAL

## 2012-10-20 MED ORDER — ALUM & MAG HYDROXIDE-SIMETH 200-200-20 MG/5ML PO SUSP: 30.0000 mL | ORAL | Status: DC | PRN

## 2012-10-20 MED ORDER — ZIPRASIDONE MESYLATE 20 MG IM SOLR: 20.0000 mg | Freq: Once | INTRAMUSCULAR | Status: AC | PRN

## 2012-10-20 MED ORDER — LORAZEPAM 1 MG PO TABS
1.0000 mg | ORAL_TABLET | Freq: Three times a day (TID) | ORAL | Status: DC | PRN
  Administered 2012-10-20 – 2012-10-21 (×2): 1 mg via ORAL
  Filled 2012-10-20 (×2): qty 1

## 2012-10-20 MED ORDER — IBUPROFEN 400 MG PO TABS: 400.0000 mg | ORAL_TABLET | Freq: Three times a day (TID) | ORAL | Status: DC | PRN

## 2012-10-20 MED ORDER — POTASSIUM CHLORIDE 20 MEQ/15ML (10%) PO LIQD
40.0000 meq | Freq: Once | ORAL | Status: DC
  Filled 2012-10-20: qty 30

## 2012-10-20 MED ORDER — NICOTINE 21 MG/24HR TD PT24: 21.0000 mg | MEDICATED_PATCH | Freq: Every day | TRANSDERMAL | Status: DC | PRN

## 2012-10-20 MED ORDER — CLONIDINE HCL 0.1 MG PO TABS
0.1000 mg | ORAL_TABLET | Freq: Two times a day (BID) | ORAL | Status: DC
  Administered 2012-10-21 (×3): 0.1 mg via ORAL
  Filled 2012-10-20 (×2): qty 1

## 2012-10-20 MED ORDER — BUSPIRONE HCL 5 MG PO TABS
ORAL_TABLET | ORAL | Status: AC
  Filled 2012-10-20: qty 2

## 2012-10-20 MED ORDER — LAMOTRIGINE 25 MG PO TABS
ORAL_TABLET | ORAL | Status: AC
  Filled 2012-10-20: qty 2

## 2012-10-20 MED ORDER — OLANZAPINE 5 MG PO TABS
ORAL_TABLET | ORAL | Status: AC
  Filled 2012-10-20: qty 2

## 2012-10-20 MED ORDER — BUSPIRONE HCL 5 MG PO TABS
10.0000 mg | ORAL_TABLET | Freq: Two times a day (BID) | ORAL | Status: DC
  Administered 2012-10-21 (×3): 10 mg via ORAL
  Filled 2012-10-20 (×2): qty 1
  Filled 2012-10-20: qty 2
  Filled 2012-10-20 (×2): qty 1
  Filled 2012-10-20: qty 2

## 2012-10-20 MED ORDER — ONDANSETRON HCL 4 MG PO TABS: 4.0000 mg | ORAL_TABLET | Freq: Three times a day (TID) | ORAL | Status: DC | PRN

## 2012-10-20 MED ORDER — OLANZAPINE 5 MG PO TABS
10.0000 mg | ORAL_TABLET | Freq: Every day | ORAL | Status: DC
  Administered 2012-10-21 (×2): 10 mg via ORAL
  Filled 2012-10-20: qty 2
  Filled 2012-10-20 (×2): qty 1

## 2012-10-20 MED ORDER — LAMOTRIGINE 25 MG PO TABS
50.0000 mg | ORAL_TABLET | Freq: Two times a day (BID) | ORAL | Status: DC
  Administered 2012-10-21 (×3): 50 mg via ORAL
  Filled 2012-10-20 (×8): qty 2

## 2012-10-20 NOTE — ED Notes (Signed)
Spoke w/ nurse, states thinks pt will come to a 400 bed but none available at this time.

## 2012-10-20 NOTE — ED Provider Notes (Signed)
TTS attempted to interview him, by telemetry. He was not cooperative. During the interview, he attempted to get up and walk out. His brother was able to convince him to sit back down. He's been fairly cooperative so far, relative to the ED, management.  I will sign IVC papers, so he can be held until he can be seen by psychiatry.    Flint Melter, MD 10/20/12 2257

## 2012-10-20 NOTE — ED Notes (Signed)
Pt states he is confused, states he has been having suicidal thoughts, denies any plan or action. Pt not sure about his medications that he takes, would not say if he has been taking. Pt calm & cooperative at this time.

## 2012-10-20 NOTE — ED Notes (Signed)
Pt in restroom changing clothes, pt gave belonging bag back w/ no draw string in it, pt made to change into a new set of scrubs w/ nurse & family in room with him. Charge nurse made aware of circumstances.

## 2012-10-20 NOTE — [facility] Counselor (Addendum)
Writer attempted to assess the pt and was UTA. Pt is responding to internal stimuli and could not participate in the assessment. The pt's younger brother is there with him and is aware that the pt will be IVC'd for his safety. The pt attempted to leave during the assessment and appeared to be extremely fearful. Writer contacted the pt's nurse Ron and Dr. Effie Shy to inform them that IVC papers need to be initiated asap and both agreed. Dr. Effie Shy confirmed that he will initiate the IVC documents. Writer contacted Janann August, NP to discuss the pt's mental status and needs, she will attempt a tele-psych with the pt. Denice Bors, [facility] 10/20/2012 10:52 PM

## 2012-10-20 NOTE — ED Provider Notes (Signed)
CSN: [id number]     Arrival date & time 10/20/12  1925 History   First MD Initiated Contact with Patient 10/20/12 1936     Chief Complaint  Patient presents with  . V70.1    HPI Pt was seen at 2035. Per pt and his brother, c/o gradual onset and worsening of persistent delusions and "not acting right" for the past several days. Pt and family states he ran out of his psychiatric medications before his symptoms began. Pt's family states he is "talking out of his head," having "hallucinations," and "doing weird things like putting the laundry soap in the refrigerator." Family describes this as per pt's usual behavior "when he doesn't take his meds."  Pt's family states pt endorsed SI to them today.  Denies HI, no SA. The patient has a significant history of similar symptoms previously and multiple prior psychiatric admissions for same.      Past Medical History  Diagnosis Date  . Bipolar 1 disorder   . Hypertension   . Schizophrenia    Past Surgical History  Procedure Laterality Date  . Colonoscopy      1999    History  Substance Use Topics  . Smoking status: Current Every Day Smoker -- 1.00 packs/day for 10 years    Types: Cigarettes  . Smokeless tobacco: Not on file  . Alcohol Use: No    Review of Systems ROS: Statement: All systems negative except as marked or noted in the HPI; Constitutional: Negative for fever and chills. ; ; Eyes: Negative for eye pain, redness and discharge. ; ; ENMT: Negative for ear pain, hoarseness, nasal congestion, sinus pressure and sore throat. ; ; Cardiovascular: Negative for chest pain, palpitations, diaphoresis, dyspnea and peripheral edema. ; ; Respiratory: Negative for cough, wheezing and stridor. ; ; Gastrointestinal: Negative for nausea, vomiting, diarrhea, abdominal pain, blood in stool, hematemesis, jaundice and rectal bleeding. . ; ; Genitourinary: Negative for dysuria, flank pain and hematuria. ; ; Musculoskeletal: Negative for back pain and neck  pain. Negative for swelling and trauma.; ; Skin: Negative for pruritus, rash, abrasions, blisters, bruising and skin lesion.; ; Neuro: Negative for headache, lightheadedness and neck stiffness. Negative for weakness, altered level of consciousness , altered mental status, extremity weakness, paresthesias, involuntary movement, seizure and syncope.; Psych:  +SI, hallucinations. No SA, no HI.     Allergies  Review of patient's allergies indicates no known allergies.  Home Medications   Current Outpatient Rx  Name  Route  Sig  Dispense  Refill  . busPIRone (BUSPAR) 10 MG tablet   Oral   Take 1 tablet (10 mg total) by mouth 2 (two) times daily. For anxiety.   60 tablet   0   . cloNIDine (CATAPRES) 0.3 MG tablet      Take one tablet each day for hypertension.   30 tablet   0   . cloNIDine (CATAPRES) 0.3 MG tablet   Oral   Take 1 tablet (0.3 mg total) by mouth daily.   30 tablet   0   . lamoTRIgine (LAMICTAL) 25 MG tablet   Oral   Take 2 tablets (50 mg total) by mouth 2 (two) times daily. Mood stabilization.   60 tablet   0   . LORazepam (ATIVAN) 1 MG tablet   Oral   Take 1 tablet (1 mg total) by mouth 3 (three) times daily as needed for anxiety.   15 tablet   0   . OLANZapine (ZYPREXA) 10 MG tablet  Oral   Take 1 tablet (10 mg total) by mouth at bedtime. For psychosis.   30 tablet   0   . traZODone (DESYREL) 50 MG tablet   Oral   Take 1 tablet (50 mg total) by mouth at bedtime and may repeat dose one time if needed. For insomnia.   30 tablet   0    BP 169/102  Pulse 102  Temp(Src) 97.3 F (36.3 C) (Oral)  Resp 20  Ht 5\' 9"  (1.753 m)  SpO2 97% Physical Exam 2040: Physical examination:  Nursing notes reviewed; Vital signs and O2 SAT reviewed;  Constitutional: Well developed, Well nourished, Well hydrated, In no acute distress; Head:  Normocephalic, atraumatic; Eyes: EOMI, PERRL, No scleral icterus; ENMT: Mouth and pharynx normal, Mucous membranes moist; Neck:  Supple, Full range of motion, No lymphadenopathy; Cardiovascular: Regular rate and rhythm, No gallop; Respiratory: Breath sounds clear & equal bilaterally, No wheezes.  Speaking full sentences with ease, Normal respiratory effort/excursion; Chest: Nontender, Movement normal; Abdomen: Soft, Nontender, Nondistended, Normal bowel sounds; Extremities: Pulses normal, No tenderness, No edema, No calf edema or asymmetry.; Neuro: AA&Ox3, Major CN grossly intact.  Speech clear. No gross focal motor or sensory deficits in extremities.; Skin: Color normal, Warm, Dry.; Psych:  Affect flat, poor eye contact, guarded speech.    ED Course  Procedures    MDM  MDM Reviewed: previous chart, nursing note and vitals Reviewed previous: labs Interpretation: labs   Results for orders placed during the hospital encounter of 10/20/12  BASIC METABOLIC PANEL      Result Value Range   Sodium 139  135 - 145 mEq/L   Potassium 2.7 (*) 3.5 - 5.1 mEq/L   Chloride 97  96 - 112 mEq/L   CO2 26  19 - 32 mEq/L   Glucose, Bld 124 (*) 70 - 99 mg/dL   BUN 8  6 - 23 mg/dL   Creatinine, Ser 1.19  0.50 - 1.35 mg/dL   Calcium 9.8  8.4 - 14.7 mg/dL   GFR calc non Af Amer >90  >90 mL/min   GFR calc Af Amer >90  >90 mL/min  CBC WITH DIFFERENTIAL      Result Value Range   WBC 12.2 (*) 4.0 - 10.5 K/uL   RBC 5.52  4.22 - 5.81 MIL/uL   Hemoglobin 15.5  13.0 - 17.0 g/dL   HCT 82.9  56.2 - 13.0 %   MCV 80.4  78.0 - 100.0 fL   MCH 28.1  26.0 - 34.0 pg   MCHC 34.9  30.0 - 36.0 g/dL   RDW 86.5  78.4 - 69.6 %   Platelets 228  150 - 400 K/uL   Neutrophils Relative % 64  43 - 77 %   Neutro Abs 7.8 (*) 1.7 - 7.7 K/uL   Lymphocytes Relative 22  12 - 46 %   Lymphs Abs 2.7  0.7 - 4.0 K/uL   Monocytes Relative 13 (*) 3 - 12 %   Monocytes Absolute 1.6 (*) 0.1 - 1.0 K/uL   Eosinophils Relative 1  0 - 5 %   Eosinophils Absolute 0.1  0.0 - 0.7 K/uL   Basophils Relative 0  0 - 1 %   Basophils Absolute 0.0  0.0 - 0.1 K/uL  ETHANOL       Result Value Range   Alcohol, Ethyl (B) <11  0 - 11 mg/dL    2952: Potassium repleted PO. Holding orders written. Psych consult pending.  Laray Anger, DO 10/20/12 2216

## 2012-10-20 NOTE — ED Notes (Signed)
Spoke with Lewisberry Sink at [facility]. She advised that she was unable to assess pt due to internal disturbances. Calling EDP to make pt IVC to do pt trying to leave.

## 2012-10-20 NOTE — Consult Note (Signed)
[facility] Face-to-Face Psychiatry Consult   Reason for Consult:  32 year old male with a diagnosis of bipolar 1 disorder brought in to the ED tonight with his brother as he has not been himself and has been exhibiting bizarre behavior.   Referring Physician: [name] is an 32 y.o. male.  Assessment: AXIS I:  Anxiety Disorder NOS and bipolar 1 disorder AXIS II:  Deferred AXIS III:   Past Medical History  Diagnosis Date  . Bipolar 1 disorder   . Hypertension   . Schizophrenia    AXIS IV:  NA AXIS V:  21-30 behavior considerably influenced by delusions or hallucinations OR serious impairment in judgment, communication OR inability to function in almost all areas  Plan:  Recommend psychiatric Inpatient admission when medically cleared.  Subjective:   [name] is a 32 y.o. male patient with bipolar disorder presented to ED, brought in by his brother.  Patient's brother reports that he has been exhibiting with bizarre behavior, confused, delirious, and has not been himself.  He reports that patient has not slept in at least 24 hours.  His brother is unsure if he has been taking his medication as prescribed. He also expresses that patient experiences these same symptoms "every year around this time" since 2004, denies any known attributing factors/events.  Behaviors that patient has been exhibiting include but not limited to: stating that he smells smoke and waking his mother up out of her sleep at night; waking his mother up at night and stating that she has been wheezing and that he was concerned about her health; placing laundry detergent in the refrigerator.  When asked why he was in the ED tonight patient reports that he is ED for "high blood pressure".  He continues, "because I am frustrated and anxious".  He expresses that he is tired and sleepy.  Patient is slow to respond to questions but is calm and cooperative.  He does not appear to be responding to external stimuli.  He  reports SI unable to elaborate.  Denies HI or AVH.  He has had at least 1 admission to [facility] in the past and an admission at [facility] as well.  He explains that the "first day" was the worst.  Explained to patient that inpatient treatment indicated at this time as he needs to be stabilized on medications.  He is reluctant when informed of plan however he later agrees that this would be in his best interest.         Past Psychiatric History: Past Medical History  Diagnosis Date  . Bipolar 1 disorder   . Hypertension   . Schizophrenia     reports that he has been smoking Cigarettes.  He has a 10 pack-year smoking history. He does not have any smokeless tobacco history on file. He reports that he uses illicit drugs (Marijuana). He reports that he does not drink alcohol. History reviewed. No pertinent family history.         Allergies:  No Known Allergies   Objective: Blood pressure 169/96, pulse 82, temperature 97.3 F (36.3 C), temperature source Oral, resp. rate 20, height 5\' 9"  (1.753 m), SpO2 100.00%.There is no weight on file to calculate BMI. Results for orders placed during the hospital encounter of 10/20/12 (from the past 72 hour(s))  BASIC METABOLIC PANEL     Status: Abnormal   Collection Time    10/20/12  8:17 PM      Result Value Range  Sodium 139  135 - 145 mEq/L   Potassium 2.7 (*) 3.5 - 5.1 mEq/L   Comment: CRITICAL RESULT CALLED TO, READ BACK BY AND VERIFIED WITH:     JOHNSON,J ON 10/20/12 AT 2050 BY LOY,C   Chloride 97  96 - 112 mEq/L   CO2 26  19 - 32 mEq/L   Glucose, Bld 124 (*) 70 - 99 mg/dL   BUN 8  6 - 23 mg/dL   Creatinine, Ser 1.61  0.50 - 1.35 mg/dL   Calcium 9.8  8.4 - 09.6 mg/dL   GFR calc non Af Amer >90  >90 mL/min   GFR calc Af Amer >90  >90 mL/min   Comment: (NOTE)     The eGFR has been calculated using the CKD EPI equation.     This calculation has not been validated in all clinical situations.     eGFR's persistently <90 mL/min signify possible  Chronic Kidney     Disease.  CBC WITH DIFFERENTIAL     Status: Abnormal   Collection Time    10/20/12  8:17 PM      Result Value Range   WBC 12.2 (*) 4.0 - 10.5 K/uL   RBC 5.52  4.22 - 5.81 MIL/uL   Hemoglobin 15.5  13.0 - 17.0 g/dL   HCT 04.5  40.9 - 81.1 %   MCV 80.4  78.0 - 100.0 fL   MCH 28.1  26.0 - 34.0 pg   MCHC 34.9  30.0 - 36.0 g/dL   RDW 91.4  78.2 - 95.6 %   Platelets 228  150 - 400 K/uL   Neutrophils Relative % 64  43 - 77 %   Neutro Abs 7.8 (*) 1.7 - 7.7 K/uL   Lymphocytes Relative 22  12 - 46 %   Lymphs Abs 2.7  0.7 - 4.0 K/uL   Monocytes Relative 13 (*) 3 - 12 %   Monocytes Absolute 1.6 (*) 0.1 - 1.0 K/uL   Eosinophils Relative 1  0 - 5 %   Eosinophils Absolute 0.1  0.0 - 0.7 K/uL   Basophils Relative 0  0 - 1 %   Basophils Absolute 0.0  0.0 - 0.1 K/uL  ETHANOL     Status: None   Collection Time    10/20/12  8:17 PM      Result Value Range   Alcohol, Ethyl (B) <11  0 - 11 mg/dL   Comment:            LOWEST DETECTABLE LIMIT FOR     SERUM ALCOHOL IS 11 mg/dL     FOR MEDICAL PURPOSES ONLY  URINE RAPID DRUG SCREEN (HOSP PERFORMED)     Status: Abnormal   Collection Time    10/20/12 10:46 PM      Result Value Range   Opiates NONE DETECTED  NONE DETECTED   Cocaine NONE DETECTED  NONE DETECTED   Benzodiazepines NONE DETECTED  NONE DETECTED   Amphetamines NONE DETECTED  NONE DETECTED   Tetrahydrocannabinol POSITIVE (*) NONE DETECTED   Barbiturates NONE DETECTED  NONE DETECTED   Comment:            DRUG SCREEN FOR MEDICAL PURPOSES     ONLY.  IF CONFIRMATION IS NEEDED     FOR ANY PURPOSE, NOTIFY LAB     WITHIN 5 DAYS.                LOWEST DETECTABLE LIMITS     FOR URINE DRUG SCREEN  Drug Class       Cutoff (ng/mL)     Amphetamine      1000     Barbiturate      200     Benzodiazepine   200     Tricyclics       300     Opiates          300     Cocaine          300     THC              50   Labs are reviewed and are pertinent for hypokalemia Potassium  2.7, replaced per RON, RN with Potassium Chloride 40 MeQ  Current Facility-Administered Medications  Medication Dose Route Frequency Provider Last Rate Last Dose  . acetaminophen (TYLENOL) tablet 650 mg  650 mg Oral Q4H PRN Laray Anger, DO      . alum & mag hydroxide-simeth (MAALOX/MYLANTA) 200-200-20 MG/5ML suspension 30 mL  30 mL Oral PRN Laray Anger, DO      . busPIRone (BUSPAR) tablet 10 mg  10 mg Oral BID Flint Melter, MD      . cloNIDine (CATAPRES) tablet 0.1 mg  0.1 mg Oral BID Flint Melter, MD      . ibuprofen (ADVIL,MOTRIN) tablet 400 mg  400 mg Oral Q8H PRN Laray Anger, DO      . lamoTRIgine (LAMICTAL) tablet 50 mg  50 mg Oral BID Flint Melter, MD      . LORazepam (ATIVAN) tablet 1 mg  1 mg Oral Q8H PRN Laray Anger, DO      . nicotine (NICODERM CQ - dosed in mg/24 hours) patch 21 mg  21 mg Transdermal Daily PRN Laray Anger, DO      . OLANZapine (ZYPREXA) tablet 10 mg  10 mg Oral QHS Flint Melter, MD      . ondansetron [facility]) tablet 4 mg  4 mg Oral Q8H PRN Laray Anger, DO      . traZODone (DESYREL) tablet 50 mg  50 mg Oral QHS,MR X 1 Flint Melter, MD      . ziprasidone (GEODON) injection 20 mg  20 mg Intramuscular Once PRN Flint Melter, MD       Current Outpatient Prescriptions  Medication Sig Dispense Refill  . busPIRone (BUSPAR) 10 MG tablet Take 1 tablet (10 mg total) by mouth 2 (two) times daily. For anxiety.  60 tablet  0  . cloNIDine (CATAPRES) 0.3 MG tablet Take one tablet each day for hypertension.  30 tablet  0  . lamoTRIgine (LAMICTAL) 25 MG tablet Take 2 tablets (50 mg total) by mouth 2 (two) times daily. Mood stabilization.  60 tablet  0  . LORazepam (ATIVAN) 1 MG tablet Take 1 tablet (1 mg total) by mouth 3 (three) times daily as needed for anxiety.  15 tablet  0  . OLANZapine (ZYPREXA) 10 MG tablet Take 1 tablet (10 mg total) by mouth at bedtime. For psychosis.  30 tablet  0  . traZODone (DESYREL) 50 MG  tablet Take 1 tablet (50 mg total) by mouth at bedtime and may repeat dose one time if needed. For insomnia.  30 tablet  0    Psychiatric Specialty Exam:     Blood pressure 169/96, pulse 82, temperature 97.3 F (36.3 C), temperature source Oral, resp. rate 20, height 5\' 9"  (1.753 m), SpO2 100.00%.There is no weight on file  to calculate BMI.  General Appearance: Well Groomed  Patent attorney::  Fair  Speech:  Blocked  Volume:  Normal  Mood:  Anxious and Depressed  Affect:  Depressed  Thought Process:  Disorganized  Orientation:  Full (Time, Place, and Person)  Thought Content:  Delusions  Suicidal Thoughts:  Yes.  without intent/plan  Homicidal Thoughts:  No  Memory:  Immediate;   Fair  Judgement:  Impaired  Insight:  Lacking  Psychomotor Activity:  Negative  Concentration:  Poor  Recall:  Fair  Akathisia:  Negative  Handed:    AIMS (if indicated):     Assets:  Communication Skills Physical Health Social Support  Sleep:      Treatment Plan/Recommendations:  Plan: review of chart, vital signs, medications, and notes. 1- Admit for crisis management and stabilization pending 400 hall bed. Estimated length of stay 5-7 days. 2- Individual and group therapy. 3- Medication management for bipolar 1 disorder and psychosis to reduce current symptoms to baseline and improve the patient's overall level of functioning.  5- Crisis stabilization and management. 6- Address health care issues- monitoring vital signs. 7- consult with Dr. Effie Shy- gave recommendations and was informed that medications were already started.   Kizzie Fantasia CORI 10/20/2012 11:27 PM   Reviewed note agree with assessment and plan.  Jacqulyn Cane, M.D.  10/21/2012 6:52 PM

## 2012-10-20 NOTE — ED Notes (Signed)
Pt with SI but denies plan, denies HI, brother is with pt and is afraid of pt hurting self and/or others

## 2012-10-20 NOTE — ED Notes (Signed)
Pt wanded by security. 

## 2012-10-20 NOTE — ED Notes (Signed)
Pt has a nicotine patch to his left arm upon arrival.

## 2012-10-20 NOTE — ED Notes (Signed)
Called [facility] to set-up consult.

## 2012-10-20 NOTE — ED Notes (Signed)
Pt sit up as if trying to leave, pt family able to get him to lay back & try to relax.

## 2012-10-21 ENCOUNTER — Inpatient Hospital Stay (HOSPITAL_COMMUNITY)
Admission: AD | Admit: 2012-10-21 | Discharge: 2012-10-27 | DRG: 885 | Disposition: A | Discharge status: Home / Self Care | Payer: Self-pay | Source: Intra-hospital | Attending: Psychiatry | Admitting: Psychiatry

## 2012-10-21 DIAGNOSIS — F1210 Cannabis abuse, uncomplicated: Secondary | ICD-10-CM | POA: Diagnosis present

## 2012-10-21 DIAGNOSIS — F314 Bipolar disorder, current episode depressed, severe, without psychotic features: Principal | ICD-10-CM

## 2012-10-21 DIAGNOSIS — Z79899 Other long term (current) drug therapy: Secondary | ICD-10-CM

## 2012-10-21 DIAGNOSIS — F3130 Bipolar disorder, current episode depressed, mild or moderate severity, unspecified: Secondary | ICD-10-CM | POA: Diagnosis present

## 2012-10-21 DIAGNOSIS — I10 Essential (primary) hypertension: Secondary | ICD-10-CM | POA: Diagnosis present

## 2012-10-21 LAB — POCT I-STAT, CHEM 8
BUN: 8 mg/dL (ref 6–23)
Creatinine, Ser: 0.8 mg/dL (ref 0.50–1.35)
Hemoglobin: 17.3 g/dL — ABNORMAL HIGH (ref 13.0–17.0)
Potassium: 3 mEq/L — ABNORMAL LOW (ref 3.5–5.1)
Sodium: 140 mEq/L (ref 135–145)

## 2012-10-21 MED ORDER — CLONIDINE HCL 0.1 MG PO TABS
ORAL_TABLET | ORAL | Status: AC
  Filled 2012-10-21: qty 1

## 2012-10-21 MED ORDER — ZIPRASIDONE MESYLATE 20 MG IM SOLR: 20.0000 mg | INTRAMUSCULAR | Status: DC | PRN

## 2012-10-21 MED ORDER — POTASSIUM CHLORIDE 20 MEQ/15ML (10%) PO LIQD
40.0000 meq | Freq: Once | ORAL | Status: AC
  Administered 2012-10-21: 40 meq via ORAL
  Filled 2012-10-21: qty 30

## 2012-10-21 MED ORDER — TRAZODONE HCL 50 MG PO TABS
ORAL_TABLET | ORAL | Status: AC
  Administered 2012-10-21: 50 mg
  Filled 2012-10-21: qty 1

## 2012-10-21 NOTE — ED Notes (Signed)
Pt talked into taking nightly meds, pt still sitting on the side of the bed, will stand up & pace in the room. Sitter remains at meal side. Pt did eat about 50% of his meal tray.

## 2012-10-21 NOTE — ED Notes (Signed)
Pt still pacing in room then will sit on the side of the bed.

## 2012-10-21 NOTE — ED Notes (Signed)
Pt requesting to use phone to call his mother, phone provided only for pt to state  He was too confused to call. Pt then asked for " a respirator, maybe that will help my head". Denies headache at this time. Continues to look dazed.

## 2012-10-21 NOTE — ED Notes (Signed)
Pt remains asleep at this time. Sitter in hallway.

## 2012-10-21 NOTE — ED Notes (Signed)
Eric at [facility] called to asked for a potassium recheck as pt's previous K was 2.7.  Pt was repleted of of 40 mEq of potassium chloride.  Chem-8 ordered per verbal.

## 2012-10-21 NOTE — ED Notes (Signed)
Pt attempting to leave the unit. Pt redirected with much persuasion needed.  Pt calm and was not aggressive. Security called to assist and RDP notified. Pt returned to room. No needs voiced.

## 2012-10-21 NOTE — ED Notes (Signed)
Per Delorise Jackson at [facility] - pt has been accepted to [facility].  Waiting on  a 400 hall bed.  No beds available tonight.  Will update tomorrow.

## 2012-10-21 NOTE — ED Notes (Signed)
Pt continues to sit on edge of the bed. Occasionally pacing in the room. Pt does not respond to questions asked, simply stares into space.

## 2012-10-21 NOTE — ED Notes (Signed)
Pt attempting to leave the unit again, staff and security escorted said pt back to his room. Pt cooperative at this time. [employer] called and ask to assist with pt safety sitting.  Pt was shackled at ankles at this time. Pt verbalized understanding for such measures.  Pt continues to speak very little, and very softly. Pt's confusion continues at this time.

## 2012-10-21 NOTE — ED Notes (Signed)
Pt resting w/ eyes closed. Bed in the low position. NAD noted.

## 2012-10-21 NOTE — Progress Notes (Signed)
This Clinical research associate spoke with ED charge nurse Lanora Manis and informed her that patient has been accepted by Janann August NP, pending 400 hall bed availability. Denver Faster RN [facility]

## 2012-10-21 NOTE — ED Notes (Signed)
Pt given breakfast meal tray. Pt states he will eat later and went back to sleep.

## 2012-10-21 NOTE — ED Provider Notes (Signed)
[facility] requested to re-check I-stat chem, specifically for potassium level (pt was given a dose PO yesterday).  Potassium level is 3; will give dose of potassium PO. [facility] has accepted pt for admission. Will transfer stable.   Laray Anger, DO 10/21/12 2059

## 2012-10-21 NOTE — ED Notes (Signed)
Pt still awake at this time. Sitter remains outside pt room.

## 2012-10-21 NOTE — ED Notes (Signed)
Pt asking for a "cbg to cure the confusion".  Pt is not a diabetic and felt this was not needed. Lunch provided to said pt.

## 2012-10-22 ENCOUNTER — Encounter (HOSPITAL_COMMUNITY): Payer: Self-pay | Admitting: *Deleted

## 2012-10-22 DIAGNOSIS — F3130 Bipolar disorder, current episode depressed, mild or moderate severity, unspecified: Secondary | ICD-10-CM

## 2012-10-22 DIAGNOSIS — F314 Bipolar disorder, current episode depressed, severe, without psychotic features: Principal | ICD-10-CM

## 2012-10-22 DIAGNOSIS — F1210 Cannabis abuse, uncomplicated: Secondary | ICD-10-CM

## 2012-10-22 MED ORDER — OLANZAPINE 10 MG PO TBDP
10.0000 mg | ORAL_TABLET | Freq: Every day | ORAL | Status: DC
  Administered 2012-10-22 – 2012-10-23 (×2): 10 mg via ORAL
  Filled 2012-10-22 (×4): qty 1

## 2012-10-22 MED ORDER — ACETAMINOPHEN 325 MG PO TABS: 650.0000 mg | ORAL_TABLET | Freq: Four times a day (QID) | ORAL | Status: DC | PRN

## 2012-10-22 MED ORDER — NAPHAZOLINE HCL 0.1 % OP SOLN
1.0000 [drp] | Freq: Four times a day (QID) | OPHTHALMIC | Status: DC | PRN
  Administered 2012-10-23 – 2012-10-24 (×2): 1 [drp] via OPHTHALMIC
  Filled 2012-10-22: qty 15

## 2012-10-22 MED ORDER — ALUM & MAG HYDROXIDE-SIMETH 200-200-20 MG/5ML PO SUSP
30.0000 mL | ORAL | Status: DC | PRN
  Administered 2012-10-24: 30 mL via ORAL

## 2012-10-22 MED ORDER — HYDROXYZINE HCL 50 MG PO TABS
50.0000 mg | ORAL_TABLET | Freq: Once | ORAL | Status: AC
  Administered 2012-10-22: 50 mg via ORAL
  Filled 2012-10-22 (×2): qty 1

## 2012-10-22 MED ORDER — CLONIDINE HCL 0.1 MG PO TABS
0.1000 mg | ORAL_TABLET | Freq: Once | ORAL | Status: AC
  Administered 2012-10-22: 0.1 mg via ORAL
  Filled 2012-10-22 (×2): qty 1

## 2012-10-22 MED ORDER — ALUM & MAG HYDROXIDE-SIMETH 200-200-20 MG/5ML PO SUSP: 30.0000 mL | ORAL | Status: DC | PRN

## 2012-10-22 MED ORDER — DIVALPROEX SODIUM 500 MG PO DR TAB
500.0000 mg | DELAYED_RELEASE_TABLET | Freq: Two times a day (BID) | ORAL | Status: DC
  Administered 2012-10-22 – 2012-10-27 (×10): 500 mg via ORAL
  Filled 2012-10-22 (×11): qty 1
  Filled 2012-10-22: qty 14
  Filled 2012-10-22 (×2): qty 1

## 2012-10-22 MED ORDER — OLANZAPINE 10 MG PO TBDP
10.0000 mg | ORAL_TABLET | Freq: Three times a day (TID) | ORAL | Status: DC | PRN
  Administered 2012-10-22 – 2012-10-23 (×3): 10 mg via ORAL
  Filled 2012-10-22 (×3): qty 1

## 2012-10-22 MED ORDER — CLONIDINE HCL 0.3 MG PO TABS
0.3000 mg | ORAL_TABLET | Freq: Every day | ORAL | Status: DC
  Administered 2012-10-22 – 2012-10-27 (×6): 0.3 mg via ORAL
  Filled 2012-10-22 (×2): qty 1
  Filled 2012-10-22: qty 3
  Filled 2012-10-22 (×5): qty 1

## 2012-10-22 MED ORDER — TRAZODONE HCL 100 MG PO TABS
100.0000 mg | ORAL_TABLET | Freq: Every evening | ORAL | Status: DC | PRN
  Administered 2012-10-22 – 2012-10-26 (×8): 100 mg via ORAL
  Filled 2012-10-22 (×4): qty 1
  Filled 2012-10-22: qty 14
  Filled 2012-10-22 (×13): qty 1

## 2012-10-22 MED ORDER — MAGNESIUM HYDROXIDE 400 MG/5ML PO SUSP: 30.0000 mL | Freq: Every day | ORAL | Status: DC | PRN

## 2012-10-22 MED ORDER — POTASSIUM CHLORIDE CRYS ER 20 MEQ PO TBCR
20.0000 meq | EXTENDED_RELEASE_TABLET | Freq: Two times a day (BID) | ORAL | Status: AC
  Administered 2012-10-22 – 2012-10-25 (×6): 20 meq via ORAL
  Filled 2012-10-22 (×7): qty 1

## 2012-10-22 NOTE — Progress Notes (Signed)
Patient ID: [name], male   DOB: July 28, 1980, 32 y.o.   MRN: [id number] D: pt. In hall standing in one spot, appears to be undecided about where to go. Pt. Reports "having a hard time explaining my thoughts". Pt. Preoccupied and reports voices "some." A: Writer introduced self to client and encouraged to take contacts out if in after washing hands. Pt. Says "don't have a room that they been moving people around and stuff" Writer explained to pt. That his room number has not changed he is still in 404 bed 1. With much coaxing pt. Went to room washed hands and attempted to get contacts out, but couldn't. "I can't do itPatent attorney encouraged pt. To take his time and get them out if indeed he has contacts in. Staff encouraged group. R: pt. Is safe on the unit and attends group. Writer unsure if indeed client has contacts in, will continue to monitor for discomfort and eye redness.

## 2012-10-22 NOTE — Progress Notes (Signed)
Patient ID: [name], male   DOB: 21-Jul-1980, 32 y.o.   MRN: [id number] Pt. Is a 32 yo male admitted with suicidal ideations, currently contracts for safety.   Pt. Report "hearing people telling me not to go certain places, and seeing things floating" Pt. Drowsy, reluctant to answer questions. "you know I really don't know" "you asking a lot of questions, that's just too much" Pt. Has been a pt. At [facility] in 4/14. Per history pt. Was taken to the ED by brother who reported client was having bizarre behaviors, reporting pt. Has not sleep in the last 24 hours.  Pt. Lives with mom. Pt UDS positive for THC,  abnormal potassium level of 3.0 and pt. Has medical hx of hypertension. Staff offered food and drink and pt. Was oriented to unit/room. Staff will monitor q57min for safety. Haig Prophet NP called for orders.

## 2012-10-22 NOTE — BHH Suicide Risk Assessment (Signed)
Suicide Risk Assessment  Admission Assessment     Nursing information obtained from:  Patient Demographic factors:  Unemployed Current Mental Status:  Suicidal ideation indicated by patient Loss Factors:  Decrease in vocational status;Financial problems / change in socioeconomic status Historical Factors:  Prior suicide attempts;Family history of mental illness or substance abuse Risk Reduction Factors:  Sense of responsibility to family;Living with another person, especially a relative  CLINICAL FACTORS:   Severe Anxiety and/or Agitation Bipolar Disorder:   Depressive phase Depression:   Anhedonia Comorbid alcohol abuse/dependence Delusional Hopelessness Impulsivity Insomnia Alcohol/Substance Abuse/Dependencies Currently Psychotic  COGNITIVE FEATURES THAT CONTRIBUTE TO RISK:  Closed-mindedness Polarized thinking    SUICIDE RISK:   Mild:  Suicidal ideation of limited frequency, intensity, duration, and specificity.  There are no identifiable plans, no associated intent, mild dysphoria and related symptoms, good self-control (both objective and subjective assessment), few other risk factors, and identifiable protective factors, including available and accessible social support.  PLAN OF CARE:1. Admit for crisis management and stabilization. 2. Medication management to reduce current symptoms to base line and improve the     patient's overall level of functioning 3. Treat health problems as indicated. 4. Develop treatment plan to decrease risk of relapse upon discharge and the need for     readmission. 5. Psycho-social education regarding relapse prevention and self care. 6. Health care follow up as needed for medical problems. 7. Restart home medications where appropriate.   I certify that inpatient services furnished can reasonably be expected to improve the patient's condition.  Thedore Mins, MD 10/22/2012, 11:16 AM

## 2012-10-22 NOTE — Tx Team (Signed)
  Interdisciplinary Treatment Plan Update   Date Reviewed:  10/22/2012  Time Reviewed:  8:42 AM  Progress in Treatment:   Attending groups: Yes Participating in groups: No Taking medication as prescribed: Yes  Tolerating medication: Yes Family/Significant other contact made: No Patient understands diagnosis: No  Limited insight  Discussing patient identified problems/goals with staff: Yes See initial care plans Medical problems stabilized or resolved: Yes Denies suicidal/homicidal ideation: Yes  In tx team Patient has not harmed self or others: Yes  For review of initial/current patient goals, please see plan of care.  Estimated Length of Stay:  4-5 days  Reason for Continuation of Hospitalization: Depression Medication stabilization Other; describe Paranoia  New Problems/Goals identified:  N/A  Discharge Plan or Barriers:   unknown  Additional Comments: 32 year old male. Presents with concern for blood that he sees on the white part of his right eye. Patient was concerned that this represented a stroke or an aneurysm patient has a history of anxiety history of bipolar disorder. Patient has been followed by behavioral health in the [place] in the past. But he lost his job recently and has not been able to followup with him. He had been on Zyprexa but he ran out of that medicine. He has history of hypertension still taking his clonidine. Patient denies any suicidal or homicidal thoughts. Patient has been having difficulty sleeping for several days now    Attendees:  Signature: Thedore Mins, MD 10/22/2012 8:42 AM   Signature: Richelle Ito, LCSW 10/22/2012 8:42 AM  Signature: Fransisca Kaufmann, NP 10/22/2012 8:42 AM  Signature: Joslyn Devon, RN 10/22/2012 8:42 AM  Signature: Liborio Nixon, RN 10/22/2012 8:42 AM  Signature:  10/22/2012 8:42 AM  Signature:   10/22/2012 8:42 AM  Signature:    Signature:    Signature:    Signature:    Signature:    Signature:      Scribe for  Treatment Team:   Richelle Ito, LCSW  10/22/2012 8:42 AM

## 2012-10-22 NOTE — H&P (Signed)
Psychiatric Admission Assessment Adult  Patient Identification:  [name] Date of Evaluation:  10/22/2012  Chief Complaint:  "The police brought me here but I don't know why."  History of Present Illness:: [name] is a 32 year old male patient who was admitted Involuntarily after being brought to the [facility] ED by his brother who reported that the patient had been exhibiting bizarre behaviors. The ED notes indicate that brother reported that [name] was not sleeping, had been acting confused waking up his mother during her sleep expressing worry about her health. The patient is a very poor historian today upon assessment so most information is gathered from the chart. He is observed wandering around the unit looking lost. Patient is able to answer basic orientation questions but is very slow to respond. The patient answers "I don't know" to almost all questions posed by Clinical research associate. He does admit to running out of his medications and smoking marijuana daily.   Elements:  Location:  [facility] inpatient. Associated Signs/Synptoms: Depression Symptoms:  depressed mood, psychomotor agitation, difficulty concentrating, recurrent thoughts of death, disturbed sleep, (Hypo) Manic Symptoms:  Delusions, Distractibility, Flight of Ideas, Impulsivity, Irritable Mood, Anxiety Symptoms:  Excessive Worry, Psychotic Symptoms:  Delusions, PTSD Symptoms: NA  Psychiatric Specialty Exam: Physical Exam  HENT:  Head: Head is without right periorbital erythema.  Eyes: Right conjunctiva is injected. Left conjunctiva is not injected.  Psychiatric: His speech is normal. His mood appears anxious. He is agitated. Thought content is paranoid. Cognition and memory are normal. He expresses impulsivity and inappropriate judgment. He exhibits a depressed mood.    Review of Systems  Constitutional: Negative.  Negative for fever, chills, weight loss, malaise/fatigue and diaphoresis.  HENT: Negative.   Negative for congestion, ear discharge, ear pain, hearing loss, nosebleeds and tinnitus.   Eyes: Negative.  Negative for blurred vision, double vision, photophobia, pain and discharge.  Respiratory: Negative.  Negative for cough, hemoptysis, sputum production, shortness of breath and stridor.   Cardiovascular: Negative.  Negative for chest pain, palpitations, orthopnea, claudication and leg swelling.  Gastrointestinal: Negative.  Negative for heartburn, nausea, vomiting, abdominal pain, diarrhea, constipation and blood in stool.  Genitourinary: Negative.  Negative for dysuria, urgency, frequency and hematuria.  Musculoskeletal: Negative.  Negative for back pain, joint pain, myalgias and neck pain.  Skin: Negative.  Negative for itching and rash.  Neurological: Negative.  Negative for dizziness, tingling, tremors, sensory change, speech change, focal weakness, seizures and headaches.  Endo/Heme/Allergies: Negative.  Negative for environmental allergies. Does not bruise/bleed easily.  Psychiatric/Behavioral: Positive for depression and substance abuse. The patient is nervous/anxious and has insomnia.     Blood pressure 159/101, pulse 119, temperature 98.2 F (36.8 C), temperature source Oral, resp. rate 18, height 5\' 7"  (1.702 m), weight 83.008 kg (183 lb).Body mass index is 28.66 kg/(m^2).  General Appearance: Discheveled  Eye Contact::  Minimal  Speech:  Pressured and Slow  Volume:  Decreased  Mood:  Anxious  Affect:  Blunt  Thought Process:  Disorganized  Orientation:  Full (Time, Place, and Person)  Thought Content:  Delusions and Paranoid Ideation  Suicidal Thoughts:  No  Homicidal Thoughts:  No  Memory:  Immediate;   Fair Recent;   Fair Remote;   Fair  Judgement:  Poor  Insight:  Lacking  Psychomotor Activity:  Increased  Concentration:  Poor  Recall:  Fair  Akathisia:  No  Handed:  Right  AIMS (if indicated):     Assets:  Communication Skills  Desire for Improvement Social  Support  Sleep:  Number of Hours: 0    Past Psychiatric History:Yes  Diagnosis: Bipolar 1  Hospitalizations: Multiple at [facility], [facility]  Outpatient Care: Not currently   Substance Abuse Care:Denies  Self-Mutilation:Denies  Suicidal Attempts:Denies  Violent Behaviors:Denies   Past Medical History:   Past Medical History  Diagnosis Date  . Bipolar 1 disorder   . Hypertension   . Schizophrenia     Allergies:  No Known Allergies PTA Medications: Prescriptions prior to admission  Medication Sig Dispense Refill  . busPIRone (BUSPAR) 10 MG tablet Take 1 tablet (10 mg total) by mouth 2 (two) times daily. For anxiety.  60 tablet  0  . cloNIDine (CATAPRES) 0.3 MG tablet Take one tablet each day for hypertension.  30 tablet  0  . lamoTRIgine (LAMICTAL) 25 MG tablet Take 2 tablets (50 mg total) by mouth 2 (two) times daily. Mood stabilization.  60 tablet  0  . LORazepam (ATIVAN) 1 MG tablet Take 1 tablet (1 mg total) by mouth 3 (three) times daily as needed for anxiety.  15 tablet  0  . OLANZapine (ZYPREXA) 10 MG tablet Take 1 tablet (10 mg total) by mouth at bedtime. For psychosis.  30 tablet  0  . traZODone (DESYREL) 50 MG tablet Take 1 tablet (50 mg total) by mouth at bedtime and may repeat dose one time if needed. For insomnia.  30 tablet  0    Previous Psychotropic Medications:  Medication/Dose  Zyprexa   Lamictal   Trazodone   Buspar   Ativan        Substance Abuse History in the last 12 months:  yes  Consequences of Substance Abuse: Negative  Social History:  reports that he has been smoking Cigarettes.  He has a 10 pack-year smoking history. He does not have any smokeless tobacco history on file. He reports that he uses illicit drugs (Marijuana). He reports that he does not drink alcohol. Additional Social History: History of alcohol / drug use?: No history of alcohol / drug abuse                    Current Place of Residence:   Place of Birth:   Family  Members: Marital Status:  Single Children:  Sons:  Daughters: Relationships: Education:  [employer] Problems/Performance: Religious Beliefs/Practices: History of Abuse (Emotional/Phsycial/Sexual) Occupational Experiences; Military History:  None. Legal History:Denies Hobbies/Interests:Spending time with family.   Family History:  History reviewed. No pertinent family history.  Results for orders placed during the hospital encounter of 10/20/12 (from the past 72 hour(s))  BASIC METABOLIC PANEL     Status: Abnormal   Collection Time    10/20/12  8:17 PM      Result Value Range   Sodium 139  135 - 145 mEq/L   Potassium 2.7 (*) 3.5 - 5.1 mEq/L   Comment: CRITICAL RESULT CALLED TO, READ BACK BY AND VERIFIED WITH:     JOHNSON,J ON 10/20/12 AT 2050 BY LOY,C   Chloride 97  96 - 112 mEq/L   CO2 26  19 - 32 mEq/L   Glucose, Bld 124 (*) 70 - 99 mg/dL   BUN 8  6 - 23 mg/dL   Creatinine, Ser 1.61  0.50 - 1.35 mg/dL   Calcium 9.8  8.4 - 09.6 mg/dL   GFR calc non Af Amer >90  >90 mL/min   GFR calc Af Amer >90  >90 mL/min   Comment: (  NOTE)     The eGFR has been calculated using the CKD EPI equation.     This calculation has not been validated in all clinical situations.     eGFR's persistently <90 mL/min signify possible Chronic Kidney     Disease.  CBC WITH DIFFERENTIAL     Status: Abnormal   Collection Time    10/20/12  8:17 PM      Result Value Range   WBC 12.2 (*) 4.0 - 10.5 K/uL   RBC 5.52  4.22 - 5.81 MIL/uL   Hemoglobin 15.5  13.0 - 17.0 g/dL   HCT 16.1  09.6 - 04.5 %   MCV 80.4  78.0 - 100.0 fL   MCH 28.1  26.0 - 34.0 pg   MCHC 34.9  30.0 - 36.0 g/dL   RDW 40.9  81.1 - 91.4 %   Platelets 228  150 - 400 K/uL   Neutrophils Relative % 64  43 - 77 %   Neutro Abs 7.8 (*) 1.7 - 7.7 K/uL   Lymphocytes Relative 22  12 - 46 %   Lymphs Abs 2.7  0.7 - 4.0 K/uL   Monocytes Relative 13 (*) 3 - 12 %   Monocytes Absolute 1.6 (*) 0.1 - 1.0 K/uL   Eosinophils Relative 1   0 - 5 %   Eosinophils Absolute 0.1  0.0 - 0.7 K/uL   Basophils Relative 0  0 - 1 %   Basophils Absolute 0.0  0.0 - 0.1 K/uL  ETHANOL     Status: None   Collection Time    10/20/12  8:17 PM      Result Value Range   Alcohol, Ethyl (B) <11  0 - 11 mg/dL   Comment:            LOWEST DETECTABLE LIMIT FOR     SERUM ALCOHOL IS 11 mg/dL     FOR MEDICAL PURPOSES ONLY  URINE RAPID DRUG SCREEN (HOSP PERFORMED)     Status: Abnormal   Collection Time    10/20/12 10:46 PM      Result Value Range   Opiates NONE DETECTED  NONE DETECTED   Cocaine NONE DETECTED  NONE DETECTED   Benzodiazepines NONE DETECTED  NONE DETECTED   Amphetamines NONE DETECTED  NONE DETECTED   Tetrahydrocannabinol POSITIVE (*) NONE DETECTED   Barbiturates NONE DETECTED  NONE DETECTED   Comment:            DRUG SCREEN FOR MEDICAL PURPOSES     ONLY.  IF CONFIRMATION IS NEEDED     FOR ANY PURPOSE, NOTIFY LAB     WITHIN 5 DAYS.                LOWEST DETECTABLE LIMITS     FOR URINE DRUG SCREEN     Drug Class       Cutoff (ng/mL)     Amphetamine      1000     Barbiturate      200     Benzodiazepine   200     Tricyclics       300     Opiates          300     Cocaine          300     THC              50  POCT I-STAT, CHEM 8     Status: Abnormal   Collection Time  10/21/12  8:04 PM      Result Value Range   Sodium 140  135 - 145 mEq/L   Potassium 3.0 (*) 3.5 - 5.1 mEq/L   Chloride 101  96 - 112 mEq/L   BUN 8  6 - 23 mg/dL   Creatinine, Ser 4.09  0.50 - 1.35 mg/dL   Glucose, Bld 811 (*) 70 - 99 mg/dL   Calcium, Ion 9.14  7.82 - 1.23 mmol/L   TCO2 27  0 - 100 mmol/L   Hemoglobin 17.3 (*) 13.0 - 17.0 g/dL   HCT 95.6  21.3 - 08.6 %   Psychological Evaluations:  Assessment:   AXIS I:  Bipolar, Depressed. Cannabis abuse AXIS II:  Deferred AXIS III:   Past Medical History  Diagnosis Date  . Bipolar 1 disorder   . Hypertension   . Schizophrenia    AXIS IV:  other psychosocial or environmental problems and  problems related to social environment AXIS V:  21-30 behavior considerably influenced by delusions or hallucinations OR serious impairment in judgment, communication OR inability to function in almost all areas  Treatment Plan/Recommendations:   1. Admit for crisis management and stabilization. Estimated length of stay 5-7 days. 2. Medication management to reduce current symptoms to base line and improve the patient's level of functioning. Reordered Zyprexa Zydis 10 mg at hs for Bipolar Disorder. Start Depakote 500 mg BID for improved mood stability.Trazodone 100 mg with repeat dose initiated to help improve sleep. 3. Develop treatment plan to decrease risk of relapse upon discharge of depressive symptoms and the need for readmission. 5. Group therapy to facilitate development of healthy coping skills to use for Bipolar symptoms.  6. Health care follow up as needed for medical problems. Continue prior to admission medication of Clonidine 0.3 mg daily for elevated blood pressure. Order Visine drops to both eyes QID prn for eye redness/irritation.  7. Discharge plan to include therapy to help patient maintain compliance with his medications. Educate regarding consequences of continued substance abuse.  8. Call for Consult with Hospitalist for additional specialty patient services as needed.    Treatment Plan Summary: Daily contact with patient to assess and evaluate symptoms and progress in treatment Medication management Current Medications:  Current Facility-Administered Medications  Medication Dose Route Frequency Provider Last Rate Last Dose  . acetaminophen (TYLENOL) tablet 650 mg  650 mg Oral Q6H PRN Nanine Means, NP      . alum & mag hydroxide-simeth (MAALOX/MYLANTA) 200-200-20 MG/5ML suspension 30 mL  30 mL Oral Q4H PRN Nanine Means, NP      . magnesium hydroxide (MILK OF MAGNESIA) suspension 30 mL  30 mL Oral Daily PRN Nanine Means, NP      . traZODone (DESYREL) tablet 100 mg  100 mg  Oral QHS,MR X 1 Nanine Means, NP   100 mg at 10/22/12 0122    Observation Level/Precautions:  routine  Laboratory:  routine  Psychotherapy:  Milieu Therapy  Medications:  See list  Consultations:  As needed  Discharge Concerns:  Safety and Stability  Estimated LOS: 5-7 days  Other:  Contact brother for collateral information    I certify that inpatient services furnished can reasonably be expected to improve the patient's condition.   DAVIS, LAURA.NP-C 10/9/201410:53 AM  Seen and agreed. Thedore Mins, MD

## 2012-10-22 NOTE — Tx Team (Signed)
Initial Interdisciplinary Treatment Plan  PATIENT STRENGTHS: (choose at least two) General fund of knowledge Supportive family/friends  PATIENT STRESSORS: Financial difficulties Health problems Medication change or noncompliance Occupational concerns   PROBLEM LIST: Problem List/Patient Goals Date to be addressed Date deferred Reason deferred Estimated date of resolution  SI 10-22-12     AVH 10-22-12     HTN 10-22-12                                          DISCHARGE CRITERIA:  Improved stabilization in mood, thinking, and/or behavior Medical problems require only outpatient monitoring Motivation to continue treatment in a less acute level of care Need for constant or close observation no longer present Reduction of life-threatening or endangering symptoms to within safe limits Verbal commitment to aftercare and medication compliance  PRELIMINARY DISCHARGE PLAN: Outpatient therapy Participate in family therapy  PATIENT/FAMIILY INVOLVEMENT: This treatment plan has been presented to and reviewed with the patient, [name], and/or family member.  The patient and family have been given the opportunity to ask questions and make suggestions.  Mickeal Needy 10/22/2012, 12:32 AM

## 2012-10-22 NOTE — BHH Counselor (Signed)
Adult Psychosocial Assessment Update Interdisciplinary Team  Previous Behavior Health Hospital admissions/discharges:  Admissions Discharges  Date: 04/27/12 Date:  Date: 11/02/10 Date:  Date: 12/10 Date:  Date: 6/10 Date:  Date: 9/09 Date:   Changes since the last Psychosocial Assessment (including adherence to outpatient mental health and/or substance abuse treatment, situational issues contributing to decompensation and/or relapse). [name] reveals little information.  Still living with mother.  No longer working.  UDS positive for cannabis and apparently not taking medications.             Discharge Plan 1. Will you be returning to the same living situation after discharge?   Yes:X No:      If no, what is your plan?    Home       2. Would you like a referral for services when you are discharged? Yes: X    If yes, for what services?  No:       Will refer to mental health for follow-up       Summary and Recommendations (to be completed by the evaluator) [name] is a 32 YO AA male who appears to be getting more symptomatic and unable to function in society with the passing of time.  He had been working at the same job for 8 years at his last admission, and is no longer working.  Has no source of income.  Chronically non-compliant with medications.  Plans to return home with his mother.  [name] can benefit from crises stabilization, medication management, therapeutic milieu and referral for services.                       Signature:  Ida Rogue, 10/22/2012 10:40 AM

## 2012-10-22 NOTE — [facility] Group Notes (Signed)
[facility] Group Notes:  (Nursing/MHT/Case Management/Adjunct)  Date:  10/22/2012 Time:  2:25 PM  Type of Therapy:  Group Therapy   Participation Level:  Did not attend.    Simona Huh MSW Intern  10/22/2012 2:25 PM

## 2012-10-22 NOTE — Progress Notes (Signed)
D:Pt is paranoid, thought blocking and guarded. Pt stared at medicine cup as writer explained medications. Pt was reluctant to take medications and required much encouragement. Pt is restless and looking over shoulder as writer attempted to talk with the pt. He nodded his head yes when asked about suicidal thoughts. He would not discuss what thoughts he was having.  A:Offered support, encouragement and redirection. R:Pt denies hi. Pt contracts with staff for safety. Safety maintained on the unit.

## 2012-10-23 DIAGNOSIS — F259 Schizoaffective disorder, unspecified: Secondary | ICD-10-CM

## 2012-10-23 LAB — BASIC METABOLIC PANEL
BUN: 17 mg/dL (ref 6–23)
Chloride: 98 mEq/L (ref 96–112)
GFR calc Af Amer: >90 mL/min (ref >90)
GFR calc non Af Amer: >90 mL/min (ref >90)
Potassium: 3.4 mEq/L — ABNORMAL LOW (ref 3.5–5.1)
Sodium: 139 mEq/L (ref 135–145)

## 2012-10-23 NOTE — Progress Notes (Signed)
Recreation Therapy Notes  Date: 10.10.2014 Time: 9:30am Location: [address] Dayroom  Group Topic: Leisure Education  Goal Area(s) Addresses:  Patient will verbalize activity of interest by end of group session. Patient will verbalize the ability to use positive leisure/recreation as a coping mechanism.  Behavioral Response: Did not attend  Jearl Klinefelter, LRT/CTRS  Khye Hochstetler L 10/23/2012 10:29 AM

## 2012-10-23 NOTE — [facility] Group Notes (Signed)
[facility] LCSW Aftercare Discharge Planning Group Note   10/23/2012 8:01 AM  Participation Quality:  Did not attend    [name]   

## 2012-10-23 NOTE — [facility] Group Notes (Signed)
[facility] LCSW Group Therapy  10/23/2012  1:05 PM  Type of Therapy:  Group therapy  Participation Level:  Did not attend.  Told me "I can't go in that room.  I have offended people in there."    Summary of Progress/Problems:  Chaplain was here to lead a group on themes of hope and courage.  Daryel Gerald B 10/23/2012 1:18 PM

## 2012-10-23 NOTE — Progress Notes (Signed)
[facility] MD Progress Note  10/23/2012 8:40 AM [name]  MRN:  [id number] Subjective: "I did not sleep last night, I was hearing voices in my head" Objective: Patient behavior remains bizarre and his thought process is disorganized. He reports difficulty sleeping due to racing thoughts, recurrent auditory hallucinations and excessive worries. Patient is paranoid, he refused his break fast due to fear of being poisoned by it. He appears, confused and hypervigilant. He reports prior history of Bipolar disorder and substance abuse and states that he was not compliant with his medications prior to his admission. However, he was self medicating with Marijuana.  Diagnosis:   DSM5: Schizophrenia Disorders:  Delusional Disorder (297.1) and Schizophrenia (295.7) Obsessive-Compulsive Disorders:   Trauma-Stressor Disorders:   Substance/Addictive Disorders:  Cannabis Use Disorder - Severe (304.30) Depressive Disorders:  Major Depressive Disorder - Moderate (296.22)  Axis I: Schizoaffective Disorder            Cannabis use disorder  ADL's:  Intact  Sleep: Poor  Appetite:  Poor  Suicidal Ideation: denies  Homicidal Ideation: denies  AEB (as evidenced by):  Psychiatric Specialty Exam: Review of Systems  Constitutional: Negative.   HENT: Negative.   Eyes: Negative.   Respiratory: Negative.   Cardiovascular: Negative.   Gastrointestinal: Negative.   Genitourinary: Negative.   Musculoskeletal: Negative.   Skin: Negative.   Neurological: Negative.   Endo/Heme/Allergies: Negative.   Psychiatric/Behavioral: Positive for depression, hallucinations and substance abuse. The patient is nervous/anxious and has insomnia.     Blood pressure 160/90, pulse 92, temperature 98.4 F (36.9 C), temperature source Oral, resp. rate 17, height 5\' 7"  (1.702 m), weight 83.008 kg (183 lb).Body mass index is 28.66 kg/(m^2).  General Appearance: Disheveled  Eye Contact::  Minimal  Speech:  Garbled  Volume:   Decreased  Mood:  Depressed and Dysphoric  Affect:  Constricted  Thought Process:  Disorganized  Orientation: only to person  Thought Content:  Delusions, Hallucinations: Auditory and Paranoid Ideation  Suicidal Thoughts:  No  Homicidal Thoughts:  No  Memory:  Immediate;   Fair Recent;   Poor Remote;   Poor  Judgement:  Poor  Insight:  Lacking  Psychomotor Activity:  Decreased  Concentration:  Fair  Recall:  Poor  Akathisia:  No  Handed:  Right  AIMS (if indicated):     Assets:  Physical Health  Sleep:  Number of Hours: 2.75   Current Medications: Current Facility-Administered Medications  Medication Dose Route Frequency Provider Last Rate Last Dose  . acetaminophen (TYLENOL) tablet 650 mg  650 mg Oral Q6H PRN Fransisca Kaufmann, NP      . alum & mag hydroxide-simeth (MAALOX/MYLANTA) 200-200-20 MG/5ML suspension 30 mL  30 mL Oral Q4H PRN Fransisca Kaufmann, NP      . cloNIDine (CATAPRES) tablet 0.3 mg  0.3 mg Oral Daily Fransisca Kaufmann, NP   0.3 mg at 10/23/12 0836  . divalproex (DEPAKOTE) DR tablet 500 mg  500 mg Oral BID PC Daphne Karrer   500 mg at 10/23/12 0836  . magnesium hydroxide (MILK OF MAGNESIA) suspension 30 mL  30 mL Oral Daily PRN Fransisca Kaufmann, NP      . naphazoline (NAPHCON) 0.1 % ophthalmic solution 1 drop  1 drop Both Eyes QID PRN Fransisca Kaufmann, NP      . OLANZapine zydis (ZYPREXA) disintegrating tablet 10 mg  10 mg Oral QHS Neelie Welshans   10 mg at 10/22/12 2140  . OLANZapine zydis (ZYPREXA) disintegrating tablet 10 mg  10  mg Oral Q8H PRN Jenille Laszlo   10 mg at 10/23/12 0353  . potassium chloride SA (K-DUR,KLOR-CON) CR tablet 20 mEq  20 mEq Oral BID Fransisca Kaufmann, NP   20 mEq at 10/23/12 0836  . traZODone (DESYREL) tablet 100 mg  100 mg Oral QHS,MR X 1 Nanine Means, NP   100 mg at 10/22/12 2330    Lab Results:  Results for orders placed during the hospital encounter of 10/21/12 (from the past 48 hour(s))  BASIC METABOLIC PANEL     Status: Abnormal   Collection Time     10/23/12  6:20 AM      Result Value Range   Sodium 139  135 - 145 mEq/L   Potassium 3.4 (*) 3.5 - 5.1 mEq/L   Chloride 98  96 - 112 mEq/L   CO2 25  19 - 32 mEq/L   Glucose, Bld 117 (*) 70 - 99 mg/dL   BUN 17  6 - 23 mg/dL   Creatinine, Ser 1.61  0.50 - 1.35 mg/dL   Calcium 09.6  8.4 - 04.5 mg/dL   GFR calc non Af Amer >90  >90 mL/min   GFR calc Af Amer >90  >90 mL/min   Comment: (NOTE)     The eGFR has been calculated using the CKD EPI equation.     This calculation has not been validated in all clinical situations.     eGFR's persistently <90 mL/min signify possible Chronic Kidney     Disease.     Performed at [facility]    Physical Findings: AIMS: Facial and Oral Movements Muscles of Facial Expression: None, normal Lips and Perioral Area: None, normal Jaw: None, normal Tongue: None, normal,Extremity Movements Upper (arms, wrists, hands, fingers): None, normal Lower (legs, knees, ankles, toes): None, normal, Trunk Movements Neck, shoulders, hips: None, normal, Overall Severity Severity of abnormal movements (highest score from questions above): None, normal Incapacitation due to abnormal movements: None, normal Patient's awareness of abnormal movements (rate only patient's report): No Awareness, Dental Status Current problems with teeth and/or dentures?: No Does patient usually wear dentures?: No  CIWA:    COWS:     Treatment Plan Summary: Daily contact with patient to assess and evaluate symptoms and progress in treatment Medication management  Plan:1. Admit for crisis management and stabilization. 2. Medication management to reduce current symptoms to base line and improve the     patient's overall level of functioning 3. Treat health problems as indicated. 4. Develop treatment plan to decrease risk of relapse upon discharge and the need for     readmission. 5. Psycho-social education regarding relapse prevention and self care. 6. Health care  follow up as needed for medical problems. 7. Restart home medications where appropriate. 8. Continue  Trazodone to 100mg  po Qhs for Insomnia. 9. Continue Depakote ER 500mg  po BID for mood 10. Increase Olanzapine to 15mg  po Qhs for delusions/psychosis   Medical Decision Making Problem Points:  Established problem, worsening (2), Review of last therapy session (1) and Review of psycho-social stressors (1) Data Points:  Order Aims Assessment (2) Review of medication regiment & side effects (2) Review of new medications or change in dosage (2)  I certify that inpatient services furnished can reasonably be expected to improve the patient's condition.   Thedore Mins, MD 10/23/2012, 8:40 AM

## 2012-10-23 NOTE — Progress Notes (Signed)
D:Pt is paranoid and fearful on the unit. Pt reports that he saw a little girl in his room this morning. He has been sleepy and stared at medicine requiring encouragement before taking. Pt reported that he had contacts in his eyes. Assessed eyes along with NP. No contacts noted. Encouraged pt to use eye drops for redness.  A:Offered support, encouragement and 15 minute checks. R:Pt denies si and hi.Safety maintained on the unit.

## 2012-10-23 NOTE — Progress Notes (Signed)
D: Pt sitting in dayroom engaged with other pts. Pt denies SI/HI. Pt reports seeing black dots. Pt c/o dry eyes d/t taking trazodone at HS. A: Medications reviewed with pt and administered as ordered per MD. Verbal support given. Pt encouraged to attend groups. 15 minute checks performed for safety. Pt attended evening group. Safety maintained.

## 2012-10-24 MED ORDER — OLANZAPINE 5 MG PO TBDP
15.0000 mg | ORAL_TABLET | Freq: Every day | ORAL | Status: DC
  Administered 2012-10-24 – 2012-10-26 (×3): 15 mg via ORAL
  Filled 2012-10-24 (×6): qty 1

## 2012-10-24 NOTE — [facility] Group Notes (Signed)
[facility] Group Notes:  (Nursing/MHT/Case Management/Adjunct)  Date:  10/24/2012  Time:  5:25 PM  Type of Therapy:  Psychoeducational Skills  Participation Level:  Active  Participation Quality:  Appropriate  Affect:  Appropriate  Cognitive:  Appropriate  Insight:  Appropriate  Engagement in Group:  Engaged  Modes of Intervention:  Problem-solving  Summary of Progress/Problems: Pt attended healthy coping skills group, and participated in outside activity.   Jourdyn Hasler Shanta 10/24/2012, 5:25 PM 

## 2012-10-24 NOTE — Progress Notes (Addendum)
Patient ID: [name], male   DOB: 04/14/1980, 32 y.o.   MRN: [id number] Psychoeducational Group Note  Date:  10/24/2012 Time:0900am  Group Topic/Focus:  Identifying Needs:   The focus of this group is to help patients identify their personal needs that have been historically problematic and identify healthy behaviors to address their needs.  Participation Level:  Minimal  Participation Quality:  Drowsy  Affect:  Lethargic  Cognitive:  Lacking  Insight:  Lacking  Engagement in Group:  Lacking  Additional Comments:inventory group   [name] 10/24/2012,9:40 AM

## 2012-10-24 NOTE — Progress Notes (Signed)
Patient ID: [name], male   DOB: 12/07/1980, 32 y.o.   MRN: [id number]  D: Pt has been very paranoid on the unit today, pt reported that he feels like there are germs all over that place and that people are trying to kill him. Pt also reported that he can not stay focused for the voices, and that they are overwhelming. Pt reported that he was negative SI, but would allow himself to be hurt if given the chance. Pt took all medications without any problems. A: 15 min checks continued for patient safety. R: Pt safety maintained.

## 2012-10-24 NOTE — Progress Notes (Signed)
The focus of this group is to help patients review their daily goal of treatment and discuss progress on daily workbooks. Pt attended the evening group session and responded to discussion prompts from the Writer. Pt reported having had a good day on the unit and communicated that he felt well enough to discharge, which he hoped to do soon. Pt's affect was appropriate and the Writer observed Pt as being more talkative and engaged than in previous groups, even interacting with his peers during the wrap-up.

## 2012-10-24 NOTE — Progress Notes (Signed)
Patient ID: [name], male   DOB: 1980/06/07, 32 y.o.   MRN: [id number] [facility] MD Progress Note  10/24/2012 4:35 PM [name]  MRN:  [id number] Subjective: "I'm doing good." Patient makes several other comments that are hard to understand. He admits to feeling paranoid but is unable to further elaborate on his symptoms.  Objective: Patient behavior remains bizarre and his thought process is disorganized. He reports difficulty sleeping due to racing thoughts, recurrent auditory hallucinations and excessive worries.  He appears, confused and hypervigilant. Patient admits to feeling paranoid and nursing notes indicate the patient feels there are germs on his body and that people are trying to kill him. The patient appears very anxious, worried and nervous.   Diagnosis:   DSM5: Schizophrenia Disorders:  Delusional Disorder (297.1) and Schizophrenia (295.7) Obsessive-Compulsive Disorders:   Trauma-Stressor Disorders:   Substance/Addictive Disorders:  Cannabis Use Disorder - Severe (304.30) Depressive Disorders:  Major Depressive Disorder - Moderate (296.22)  Axis I: Schizoaffective Disorder            Cannabis use disorder  ADL's:  Intact  Sleep: Poor  Appetite:  Poor  Suicidal Ideation: denies  Homicidal Ideation: denies  AEB (as evidenced by):  Psychiatric Specialty Exam: Review of Systems  Constitutional: Negative.   HENT: Negative.   Eyes: Negative.   Respiratory: Negative.   Cardiovascular: Negative.   Gastrointestinal: Negative.   Genitourinary: Negative.   Musculoskeletal: Negative.   Skin: Negative.   Neurological: Negative.   Endo/Heme/Allergies: Negative.   Psychiatric/Behavioral: Positive for depression, hallucinations and substance abuse. The patient is nervous/anxious and has insomnia.     Blood pressure 138/85, pulse 102, temperature 98 F (36.7 C), temperature source Oral, resp. rate 20, height 5\' 7"  (1.702 m), weight 83.008 kg (183 lb).Body mass  index is 28.66 kg/(m^2).  General Appearance: Disheveled  Eye Contact::  Minimal  Speech:  Garbled  Volume:  Decreased  Mood:  Depressed and Dysphoric  Affect:  Constricted  Thought Process:  Disorganized  Orientation: only to person  Thought Content:  Delusions, Hallucinations: Auditory and Paranoid Ideation  Suicidal Thoughts:  No  Homicidal Thoughts:  No  Memory:  Immediate;   Fair Recent;   Poor Remote;   Poor  Judgement:  Poor  Insight:  Lacking  Psychomotor Activity:  Decreased  Concentration:  Fair  Recall:  Poor  Akathisia:  No  Handed:  Right  AIMS (if indicated):     Assets:  Physical Health  Sleep:  Number of Hours: 6.5   Current Medications: Current Facility-Administered Medications  Medication Dose Route Frequency Provider Last Rate Last Dose  . acetaminophen (TYLENOL) tablet 650 mg  650 mg Oral Q6H PRN Fransisca Kaufmann, NP      . alum & mag hydroxide-simeth (MAALOX/MYLANTA) 200-200-20 MG/5ML suspension 30 mL  30 mL Oral Q4H PRN Fransisca Kaufmann, NP      . cloNIDine (CATAPRES) tablet 0.3 mg  0.3 mg Oral Daily Fransisca Kaufmann, NP   0.3 mg at 10/24/12 0800  . divalproex (DEPAKOTE) DR tablet 500 mg  500 mg Oral BID PC Mojeed Akintayo   500 mg at 10/24/12 1617  . magnesium hydroxide (MILK OF MAGNESIA) suspension 30 mL  30 mL Oral Daily PRN Fransisca Kaufmann, NP      . naphazoline (NAPHCON) 0.1 % ophthalmic solution 1 drop  1 drop Both Eyes QID PRN Fransisca Kaufmann, NP   1 drop at 10/24/12 [phone]  . OLANZapine zydis (ZYPREXA) disintegrating tablet 10 mg  10  mg Oral QHS Mojeed Akintayo   10 mg at 10/23/12 2140  . OLANZapine zydis (ZYPREXA) disintegrating tablet 10 mg  10 mg Oral Q8H PRN Mojeed Akintayo   10 mg at 10/23/12 1314  . potassium chloride SA (K-DUR,KLOR-CON) CR tablet 20 mEq  20 mEq Oral BID Fransisca Kaufmann, NP   20 mEq at 10/24/12 1617  . traZODone (DESYREL) tablet 100 mg  100 mg Oral QHS,MR X 1 Nanine Means, NP   100 mg at 10/23/12 2141    Lab Results:  Results for orders placed during  the hospital encounter of 10/21/12 (from the past 48 hour(s))  BASIC METABOLIC PANEL     Status: Abnormal   Collection Time    10/23/12  6:20 AM      Result Value Range   Sodium 139  135 - 145 mEq/L   Potassium 3.4 (*) 3.5 - 5.1 mEq/L   Chloride 98  96 - 112 mEq/L   CO2 25  19 - 32 mEq/L   Glucose, Bld 117 (*) 70 - 99 mg/dL   BUN 17  6 - 23 mg/dL   Creatinine, Ser 1.61  0.50 - 1.35 mg/dL   Calcium 09.6  8.4 - 04.5 mg/dL   GFR calc non Af Amer >90  >90 mL/min   GFR calc Af Amer >90  >90 mL/min   Comment: (NOTE)     The eGFR has been calculated using the CKD EPI equation.     This calculation has not been validated in all clinical situations.     eGFR's persistently <90 mL/min signify possible Chronic Kidney     Disease.     Performed at [facility]    Physical Findings: AIMS: Facial and Oral Movements Muscles of Facial Expression: None, normal Lips and Perioral Area: None, normal Jaw: None, normal Tongue: None, normal,Extremity Movements Upper (arms, wrists, hands, fingers): None, normal Lower (legs, knees, ankles, toes): None, normal, Trunk Movements Neck, shoulders, hips: None, normal, Overall Severity Severity of abnormal movements (highest score from questions above): None, normal Incapacitation due to abnormal movements: None, normal Patient's awareness of abnormal movements (rate only patient's report): No Awareness, Dental Status Current problems with teeth and/or dentures?: No Does patient usually wear dentures?: No  CIWA:    COWS:     Treatment Plan Summary: Daily contact with patient to assess and evaluate symptoms and progress in treatment Medication management  Plan:1. Continue crisis management and stabilization. 2. Medication management to reduce current symptoms to base line and improve the patient's overall level of functioning 3. Treat health problems as indicated. 4. Develop treatment plan to decrease risk of relapse upon discharge  and the need for readmission. 5. Psycho-social education regarding relapse prevention and self care. 6. Health care follow up as needed for medical problems. 7. Restart home medications where appropriate. 8. Continue Trazodone to 100mg  po Qhs for Insomnia. 9. Continue Depakote ER 500mg  po BID for mood 10. Continue Olanzapine to 15mg  po Qhs for delusions/psychosis  Medical Decision Making Problem Points:  Established problem, worsening (2), Review of last therapy session (1) and Review of psycho-social stressors (1) Data Points:  Order Aims Assessment (2) Review of medication regiment & side effects (2) Review of new medications or change in dosage (2)  I certify that inpatient services furnished can reasonably be expected to improve the patient's condition.   Fransisca Kaufmann, NP-C 10/24/2012, 4:35 PM I agreed with findings and treatment plan of this patient

## 2012-10-25 NOTE — [facility] Counselor (Signed)
Adult Psychoeducational Group Note  Date:  10/25/2012 Time:  6:48 PM  Group Topic/Focus:  Making Healthy Choices:   The focus of this group is to help patients identify negative/unhealthy choices they were using prior to admission and identify positive/healthier coping strategies to replace them upon discharge.  Participation Level:  Active  Participation Quality:  Appropriate  Affect:  Appropriate  Cognitive:  Appropriate  Insight: Appropriate  Engagement in Group:  Engaged  Modes of Intervention:  Discussion, Exploration and Socialization  Additional Comments:  Pt attended group and participated during the group activity.  Tania Ade 10/25/2012, 6:48 PM

## 2012-10-25 NOTE — Progress Notes (Signed)
Patient ID: [name], male   DOB: July 27, 1980, 32 y.o.   MRN: [id number] 10-25-12 @ 1317 nursing shift note; D: RN did 1:1 with pt. He denied any si/hi/av. He denied any pain and stated that he feeling better. He lives with his mother, is single and does not have any children. A: RN asked probing questions and he stated overall he feels as though he is improving mentally. He is a smoker at 1 ppd and refused a nicotine patch. He stated he doesn't use etoh any longer. He is taking his medications, not having any adverse effects and is coming to groups. R: he stated his medications are making him sleepy. RN will monitor and Q 15 min ck's continue.

## 2012-10-25 NOTE — [facility] Group Notes (Signed)
[facility] Group Notes:  (Nursing/MHT/Case Management/Adjunct)  Date:  10/25/2012  Time:  11:46 AM  Type of Therapy:  Psychoeducational Skills  Participation Level:  Did Not Attend    Buford Dresser 10/25/2012, 11:46 AM

## 2012-10-25 NOTE — [facility] Group Notes (Signed)
[facility] Group Notes: (Clinical Social Work)   10/25/2012      Type of Therapy:  Group Therapy   Participation Level:  Did Not Attend    Ambrose Mantle, LCSW 10/25/2012, 12:49 PM

## 2012-10-25 NOTE — Progress Notes (Signed)
The focus of this group is to help patients review their daily goal of treatment and discuss progress on daily workbooks. Pt attended the evening group session and responded to discussion prompts from the Writer. Pt reported having had a good day on the unit and felt more clarity and focus than in previous days. Pt shared that he planned to take better care of himself upon discharge by taking his medications and not smoke weed. Pt reported having no additional needs from Nursing Staff this evening as well. Pt's affect was appropriate and he did appear clearer tonight than in previous groups.

## 2012-10-25 NOTE — Progress Notes (Signed)
Pt less paranoid this evening. Speech more spontaneous though still somewhat guarded. While he endorsed hallucinations earlier today, he presently denies. Continues to state eye redness is due to contacts that he's had in for quite awhile. States NP examined eyes however and did not see contacts. Pt asked to shave and was assisted by MHT. Pt medicated per orders. Given support and encouragement. Has not required repeat trazadone. Denies SI/HI and remains safe. [name]

## 2012-10-25 NOTE — Progress Notes (Signed)
Patient ID: [name], male   DOB: 1980/11/14, 32 y.o.   MRN: [id number] Psychoeducational Group Note  Date:  10/25/2012 Time:  0930am  Group Topic/Focus:  Making Healthy Choices:   The focus of this group is to help patients identify negative/unhealthy choices they were using prior to admission and identify positive/healthier coping strategies to replace them upon discharge.  Participation Level:  Minimal  Participation Quality:  Drowsy  Affect:  Depressed  Cognitive:  Appropriate  Insight:  Lacking  Engagement in Group:  Lacking  Additional Comments:  Inventory group   Valente David 10/25/2012,10:17 AM

## 2012-10-25 NOTE — Progress Notes (Signed)
Patient observed in dayroom. Denies SI, HI, AH, VH. Patient states he slept well. Patient was engaged in conversation with Clinical research associate. Patient was somatic, stated that his morning hoarseness may be cancer.  Patient safety maintained on unit, Q15 checks.

## 2012-10-25 NOTE — Progress Notes (Signed)
Patient ID: [name], male   DOB: 1980-12-27, 32 y.o.   MRN: [id number]     [facility] MD Progress Note  10/25/2012 3:13 PM [name]  MRN:  [id number] Subjective:Patient is able to more clearly communicate today. Patient states "I'm feeling less confused. I don't think smoking marijuana is good for me. I do good when I take the Zyprexa."   Objective:  Patient is less guarded and experiencing hallucinations less often. Today he is dressed in his own clothes and is able to more easily engage in conversation. He denies feeling paranoid but nursing staff report he is still making comments saying that because he is somewhat hoarse that he may have cancer.   Diagnosis:   DSM5: Schizophrenia Disorders:  Delusional Disorder (297.1) and Schizophrenia (295.7) Obsessive-Compulsive Disorders:   Trauma-Stressor Disorders:   Substance/Addictive Disorders:  Cannabis Use Disorder - Severe (304.30) Depressive Disorders:  Major Depressive Disorder - Moderate (296.22)  Axis I: Schizoaffective Disorder            Cannabis use disorder  ADL's:  Intact  Sleep: Good  Appetite:  Fair  Suicidal Ideation: denies  Homicidal Ideation: denies  AEB (as evidenced by):  Psychiatric Specialty Exam: Review of Systems  Constitutional: Negative.   HENT: Negative.   Eyes: Negative.   Respiratory: Negative.   Cardiovascular: Negative.   Gastrointestinal: Negative.   Genitourinary: Negative.   Musculoskeletal: Negative.   Skin: Negative.   Neurological: Negative.   Endo/Heme/Allergies: Negative.   Psychiatric/Behavioral: Positive for depression, hallucinations and substance abuse. Negative for suicidal ideas and memory loss. The patient is nervous/anxious. The patient does not have insomnia.     Blood pressure 141/87, pulse 102, temperature 97.9 F (36.6 C), temperature source Oral, resp. rate 22, height 5\' 7"  (1.702 m), weight 83.008 kg (183 lb).Body mass index is 28.66 kg/(m^2).  General  Appearance: Disheveled  Eye Contact::  Minimal  Speech:  Garbled  Volume:  Decreased  Mood:  Depressed and Dysphoric  Affect:  Constricted  Thought Process:  Disorganized  Orientation: only to person  Thought Content:  Delusions, Hallucinations: Auditory and Paranoid Ideation  Suicidal Thoughts:  No  Homicidal Thoughts:  No  Memory:  Immediate;   Fair Recent;   Poor Remote;   Poor  Judgement:  Poor  Insight:  Lacking  Psychomotor Activity:  Decreased  Concentration:  Fair  Recall:  Poor  Akathisia:  No  Handed:  Right  AIMS (if indicated):     Assets:  Physical Health  Sleep:  Number of Hours: 6.75   Current Medications: Current Facility-Administered Medications  Medication Dose Route Frequency Provider Last Rate Last Dose  . acetaminophen (TYLENOL) tablet 650 mg  650 mg Oral Q6H PRN Fransisca Kaufmann, NP      . alum & mag hydroxide-simeth (MAALOX/MYLANTA) 200-200-20 MG/5ML suspension 30 mL  30 mL Oral Q4H PRN Fransisca Kaufmann, NP   30 mL at 10/24/12 2137  . cloNIDine (CATAPRES) tablet 0.3 mg  0.3 mg Oral Daily Fransisca Kaufmann, NP   0.3 mg at 10/25/12 0746  . divalproex (DEPAKOTE) DR tablet 500 mg  500 mg Oral BID PC Mojeed Akintayo   500 mg at 10/25/12 0746  . magnesium hydroxide (MILK OF MAGNESIA) suspension 30 mL  30 mL Oral Daily PRN Fransisca Kaufmann, NP      . naphazoline (NAPHCON) 0.1 % ophthalmic solution 1 drop  1 drop Both Eyes QID PRN Fransisca Kaufmann, NP   1 drop at 10/24/12 [phone]  .  OLANZapine zydis (ZYPREXA) disintegrating tablet 10 mg  10 mg Oral Q8H PRN Mojeed Akintayo   10 mg at 10/23/12 1314  . OLANZapine zydis (ZYPREXA) disintegrating tablet 15 mg  15 mg Oral QHS Fransisca Kaufmann, NP   15 mg at 10/24/12 2135  . traZODone (DESYREL) tablet 100 mg  100 mg Oral QHS,MR X 1 Nanine Means, NP   100 mg at 10/24/12 2134    Lab Results:  No results found for this or any previous visit (from the past 48 hour(s)).  Physical Findings: AIMS: Facial and Oral Movements Muscles of Facial Expression:  None, normal Lips and Perioral Area: None, normal Jaw: None, normal Tongue: None, normal,Extremity Movements Upper (arms, wrists, hands, fingers): None, normal Lower (legs, knees, ankles, toes): None, normal, Trunk Movements Neck, shoulders, hips: None, normal, Overall Severity Severity of abnormal movements (highest score from questions above): None, normal Incapacitation due to abnormal movements: None, normal Patient's awareness of abnormal movements (rate only patient's report): No Awareness, Dental Status Current problems with teeth and/or dentures?: No Does patient usually wear dentures?: No  CIWA:    COWS:     Treatment Plan Summary: Daily contact with patient to assess and evaluate symptoms and progress in treatment Medication management  Plan:1. Continue crisis management and stabilization. 2. Medication management to reduce current symptoms to base line and improve the patient's overall level of functioning 3. Treat health problems as indicated. 4. Develop treatment plan to decrease risk of relapse upon discharge and the need for readmission. 5. Psycho-social education regarding relapse prevention and self care. 6. Health care follow up as needed for medical problems. 7. Restart home medications where appropriate. 8. Continue Trazodone to 100mg  po Qhs for Insomnia. 9. Continue Depakote ER 500mg  po BID for mood. Obtain Depakote level in am.  10. Continue Olanzapine to 15mg  po Qhs for delusions/psychosis  Medical Decision Making Problem Points:  Established problem, worsening (2), Review of last therapy session (1) and Review of psycho-social stressors (1) Data Points:  Order Aims Assessment (2) Review of medication regiment & side effects (2) Review of new medications or change in dosage (2)  I certify that inpatient services furnished can reasonably be expected to improve the patient's condition.   Fransisca Kaufmann, NP-C 10/25/2012, 3:13 PM I I agreed with findings and  treatment plan of this patient

## 2012-10-25 NOTE — Progress Notes (Signed)
Pt continues to show improvement. Speech is more spontaneous. Pt does at times stare off and appear preoccupied though he states he feels more focused and less confused. Denies any hallucinations tonight. He continues to express concern about his hoarseness though suggests possibly it is due to the seasons changing/possible allergies. Pt states he is doing well off of cigarettes and may consider giving them up upon discharge. Pt supported, praised. Med education given as he did express some confusion regarding med changes. Did however state that current meds are working for him. No SI/HI and remains safe. Lawrence Marseilles

## 2012-10-26 NOTE — [facility] Group Notes (Signed)
[facility] LCSW Aftercare Discharge Planning Group Note   10/26/2012 1:58 PM  Participation Quality:  Engaged  Mood/Affect:  Flat  Depression Rating:  5  Anxiety Rating:  5  Thoughts of Suicide:  No Will you contract for safety?   NA  Current AVH:  Still a bit of disorganization, but much better than Friday  Plan for Discharge/Comments:  Remijio is brighter, less guarded and more spontaneous today than he was on Friday.  He is able to engage in conversation.  States he has not worked since September, and needs to find a job so he can pay bills.  Plans to return to his mother's home.  Transportation Means: mother  Supports:  mother  Ida Rogue

## 2012-10-26 NOTE — Progress Notes (Signed)
Adult Psychoeducational Group Note  Date:  10/26/2012 Time:  11:00AM Group Topic/Focus:  Wellness Toolbox:   The focus of this group is to discuss various aspects of wellness, balancing those aspects and exploring ways to increase the ability to experience wellness.  Patients will create a wellness toolbox for use upon discharge.  Participation Level:  Active  Participation Quality:  Appropriate and Attentive  Affect:  Appropriate  Cognitive:  Alert and Appropriate  Insight: Appropriate  Engagement in Group:  Engaged  Modes of Intervention:  Discussion  Additional Comments:  Pt. Was appropriate and attentive during today's group discussion. Pt was able to complete self care assessment. Pt shared that he scored a 3 on the following things eat healthy, exercise, and wear the clothes I like. Pt shared that he need to improve on the following get enough sleep and taking medication as prescribed.   Bing Plume D 10/26/2012, 1:33 PM

## 2012-10-26 NOTE — Progress Notes (Signed)
D: Patient appeared calm and appropriate during this assessment. He reported feeling better than when he first got here. He denied SI/HI and denied hallucinations. Although he reported having slight confusion; "it's clearing off, I know I will feel better when I got out of here".  A: Writer encouraged and supported patient.  R: Patient receptive to encouragement and support Q 15 minute check continues as ordered to maintain safety.

## 2012-10-26 NOTE — [facility] Group Notes (Signed)
[facility] LCSW Group Therapy  10/26/2012 1:15 pm  Type of Therapy: Process Group Therapy  Participation Level:  Active  Participation Quality:  Appropriate  Affect:  Flat  Cognitive:  Oriented  Insight:  Improving  Engagement in Group:  Limited  Engagement in Therapy:  Limited  Modes of Intervention:  Activity, Clarification, Education, Problem-solving and Support  Summary of Progress/Problems: Today's group addressed the issue of overcoming obstacles.  Patients were asked to identify their biggest obstacle post d/c that stands in the way of their on-going success, and then problem solve as to how to manage this.  Stated his biggest challenge is getting an outside line to be able to call his family.  He was serious.  So we figured it out, and reframed his request as doing a good job of asking for help.  He then identified employment as another obstacle since he lost his job of 8 years at the beginning of Sept.  He states he has some ideas of how to proceed, and admits he was unable to look prior to admission "because I was not thinking too clearly."  Daryel Gerald B 10/26/2012   5:09 PM

## 2012-10-26 NOTE — Progress Notes (Signed)
Recreation Therapy Notes  Date: 10.13.2014 Time: 9:30am Location: [address] Dayroom  Group Topic: Leisure Education  Goal Area(s) Addresses:  Patient will verbalize activity of interest by end of group session. Patient will verbalize the ability to use positive leisure/recreation as a coping mechanism.  Behavioral Response: Engaged, Appropriate, Drowsy  Intervention: Game  Activity: Patients were asked to select a letter of the alphabet, state an activity that starts with that letter and then identify an emotion they experience when participating in selected activity.   Education:  Leisure Education, Pharmacologist, Building control surveyor.   Education Outcome: Acknowledges understanding  Clinical Observations/Feedback: Patient participated in group activity, however he was observed to hand head and close eyes periodically appearing to sleep during group session. Patient was able to state appropriate activities, as well as emotions when asked to do so. Patient additionally offered assistance to peers as needed.   Marykay Lex Willona Phariss, LRT/CTRS  Jearl Klinefelter 10/26/2012 12:58 PM

## 2012-10-26 NOTE — Progress Notes (Signed)
Patient ID: [name], male   DOB: Jun 28, 1980, 32 y.o.   MRN: [id number]     [facility] MD Progress Note  10/26/2012 10:30 AM [name]  MRN:  [id number] Subjective: " I am feeling  less confused but my thinking is still clouded. I should not have gone back to smoking Marijuana, it has messed my life up, I got fired from my job at [employer] because of my behavior." Objective: Patient reports decreased mood swings, agitation, delusions and psychotic symptoms. He has been acting less bizarre and his thought process has become more organized. He says he has been sleeping better and thinks that his current medication combination is helping. He denies any adverse reactions to his medications. Valproic acid level: 104.2 Diagnosis:   DSM5: Schizophrenia Disorders:  Delusional Disorder (297.1) and Schizophrenia (295.7) Obsessive-Compulsive Disorders:   Trauma-Stressor Disorders:   Substance/Addictive Disorders:  Cannabis Use Disorder - Severe (304.30) Depressive Disorders:  Major Depressive Disorder - Moderate (296.22)  Axis I: Schizoaffective Disorder            Cannabis use disorder  ADL's:  Intact  Sleep: Good  Appetite:  Fair  Suicidal Ideation: denies  Homicidal Ideation: denies  AEB (as evidenced by):  Psychiatric Specialty Exam: Review of Systems  Constitutional: Negative.   HENT: Negative.   Eyes: Negative.   Respiratory: Negative.   Cardiovascular: Negative.   Gastrointestinal: Negative.   Genitourinary: Negative.   Musculoskeletal: Negative.   Skin: Negative.   Neurological: Negative.   Endo/Heme/Allergies: Negative.   Psychiatric/Behavioral: Positive for depression and substance abuse. Negative for suicidal ideas and memory loss. The patient is nervous/anxious. The patient does not have insomnia.     Blood pressure 152/99, pulse 94, temperature 97.7 F (36.5 C), temperature source Oral, resp. rate 20, height 5\' 7"  (1.702 m), weight 83.008 kg (183  lb).Body mass index is 28.66 kg/(m^2).  General Appearance: fairly groomed  Patent attorney::  normal  Speech:  coherent  Volume:  Decreased  Mood:  Depressed   Affect:  Constricted  Thought Process:  linear  Orientation: intact  Thought Content:  Delusions, Hallucinations  Suicidal Thoughts:  No  Homicidal Thoughts:  No  Memory:  Immediate;   Fair Recent;   fair Remote;   fair  Judgement:  Poor  Insight:  Lacking  Psychomotor Activity:  Decreased  Concentration:  Fair  Recall:  Poor  Akathisia:  No  Handed:  Right  AIMS (if indicated):     Assets:  Physical Health  Sleep:  Number of Hours: 6.25   Current Medications: Current Facility-Administered Medications  Medication Dose Route Frequency Provider Last Rate Last Dose  . acetaminophen (TYLENOL) tablet 650 mg  650 mg Oral Q6H PRN Fransisca Kaufmann, NP      . alum & mag hydroxide-simeth (MAALOX/MYLANTA) 200-200-20 MG/5ML suspension 30 mL  30 mL Oral Q4H PRN Fransisca Kaufmann, NP   30 mL at 10/24/12 2137  . cloNIDine (CATAPRES) tablet 0.3 mg  0.3 mg Oral Daily Fransisca Kaufmann, NP   0.3 mg at 10/26/12 0752  . divalproex (DEPAKOTE) DR tablet 500 mg  500 mg Oral BID PC Jakyiah Briones   500 mg at 10/26/12 0751  . magnesium hydroxide (MILK OF MAGNESIA) suspension 30 mL  30 mL Oral Daily PRN Fransisca Kaufmann, NP      . naphazoline (NAPHCON) 0.1 % ophthalmic solution 1 drop  1 drop Both Eyes QID PRN Fransisca Kaufmann, NP   1 drop at 10/24/12 [phone]  .  OLANZapine zydis (ZYPREXA) disintegrating tablet 10 mg  10 mg Oral Q8H PRN Juliauna Stueve   10 mg at 10/23/12 1314  . OLANZapine zydis (ZYPREXA) disintegrating tablet 15 mg  15 mg Oral QHS Fransisca Kaufmann, NP   15 mg at 10/25/12 2111  . traZODone (DESYREL) tablet 100 mg  100 mg Oral QHS,MR X 1 Nanine Means, NP   100 mg at 10/25/12 2339    Lab Results:  Results for orders placed during the hospital encounter of 10/21/12 (from the past 48 hour(s))  VALPROIC ACID LEVEL     Status: Abnormal   Collection Time    10/25/12   7:17 PM      Result Value Range   Valproic Acid Lvl 104.2 (*) 50.0 - 100.0 ug/mL   Comment: Performed at [facility]    Physical Findings: AIMS: Facial and Oral Movements Muscles of Facial Expression: None, normal Lips and Perioral Area: None, normal Jaw: None, normal Tongue: None, normal,Extremity Movements Upper (arms, wrists, hands, fingers): None, normal Lower (legs, knees, ankles, toes): None, normal, Trunk Movements Neck, shoulders, hips: None, normal, Overall Severity Severity of abnormal movements (highest score from questions above): None, normal Incapacitation due to abnormal movements: None, normal Patient's awareness of abnormal movements (rate only patient's report): No Awareness, Dental Status Current problems with teeth and/or dentures?: No Does patient usually wear dentures?: No  CIWA:    COWS:     Treatment Plan Summary: Daily contact with patient to assess and evaluate symptoms and progress in treatment Medication management  Plan:1. Continue crisis management and stabilization. 2. Medication management to reduce current symptoms to base line and improve the patient's overall level of functioning 3. Treat health problems as indicated. 4. Develop treatment plan to decrease risk of relapse upon discharge and the need for readmission. 5. Psycho-social education regarding relapse prevention and self care. 6. Health care follow up as needed for medical problems. 7. Restart home medications where appropriate. 8. Continue Trazodone to 100mg  po Qhs for Insomnia. 9. Continue Depakote ER 500mg  po BID for mood. Obtain Depakote level in am.  10. Continue Olanzapine to 15mg  po Qhs for delusions/psychosis  Medical Decision Making Problem Points:  Established problem, improving (1), Review of last therapy session (1) and Review of psycho-social stressors (1) Data Points:  Order Aims Assessment (2) Review of medication regiment & side effects (2) Review of new  medications or change in dosage (2)  I certify that inpatient services furnished can reasonably be expected to improve the patient's condition.   Thedore Mins, MD 10/26/2012, 10:30 AM

## 2012-10-26 NOTE — Progress Notes (Signed)
Patient ID: AH BOTT, male   DOB: 1980-06-25, 32 y.o.   MRN: [id number] D:Patient is interacting well with staff and others.  He denies any SI/HI/AVH today.  He remains somewhat guarded and cautious.  His affect is bright with good eye contact.  Patient has been attending groups and participating in his care.  Patient met with MD and stated that he had been using THC and ran out of his medication.  Patient stated he slept well last night with medication.  He also stated he was trying to get [employer] since he lost his job.  A:Continue to manage medication management and MD orders.  Safety checks completed every 15 minutes per protocol.  R:Patient's behavior has been appropriate.

## 2012-10-27 DIAGNOSIS — F314 Bipolar disorder, current episode depressed, severe, without psychotic features: Principal | ICD-10-CM

## 2012-10-27 MED ORDER — TRAZODONE HCL 100 MG PO TABS: 100.0000 mg | ORAL_TABLET | Freq: Every evening | ORAL | Status: DC | PRN

## 2012-10-27 MED ORDER — OLANZAPINE 5 MG PO TBDP
15.0000 mg | ORAL_TABLET | Freq: Every day | ORAL | Status: DC
  Filled 2012-10-27 (×2): qty 21

## 2012-10-27 MED ORDER — DIVALPROEX SODIUM 500 MG PO DR TAB: 500.0000 mg | DELAYED_RELEASE_TABLET | Freq: Two times a day (BID) | ORAL | Status: DC

## 2012-10-27 MED ORDER — OLANZAPINE 15 MG PO TBDP: 15.0000 mg | ORAL_TABLET | Freq: Every day | ORAL | Status: DC

## 2012-10-27 NOTE — Progress Notes (Signed)
[facility] Adult Case Management Discharge Plan :  Will you be returning to the same living situation after discharge: Yes,  home At discharge, do you have transportation home?:Yes,  mother Do you have the ability to pay for your medications:Yes,  mental health  Release of information consent forms completed and in the chart;  Patient's signature needed at discharge.  Patient to Follow up at: Follow-up Information   Follow up with [facility] On 10/29/2012. (Go to the walk-in clinic on Thursday between 8 and 9AM for your hospital follow up appointment)    Contact information:   [address]  [place]  [phone]      Patient denies SI/HI:   Yes,  yes    Safety Planning and Suicide Prevention discussed:  Yes,  yes  Daryel Gerald B 10/27/2012, 12:42 PM

## 2012-10-27 NOTE — Discharge Summary (Signed)
Physician Discharge Summary Note  Patient:  [name] is an 32 y.o., male MRN:  [id number] DOB:  1980-07-29 Patient phone:  [phone] (home)  Patient address:   [address] Boneta Lucks [address] [place] [address]   Date of Admission:  10/21/2012 Date of Discharge: 10/27/12  Discharge Diagnoses: Principal Problem:   Bipolar I disorder, most recent episode (or current) depressed, severe, with mention of psychotic behavior Active Problems:   Bipolar I disorder, most recent episode depressed   Cannabis abuse  Axis Diagnosis:  AXIS I: Bipolar I disorder, most recent episode (or current) depressed, severe, without mention of psychotic behavior  Cannabis use disorder  AXIS II: Deferred  AXIS III:  Past Medical History   Diagnosis  Date   .  Hypertension    AXIS IV: other psychosocial or environmental problems and problems related to social environment  AXIS V: 61-70 mild symptoms  Level of Care:  OP  Hospital Course:   [name] is a 32 year old male patient who was admitted Involuntarily after being brought to the [facility] ED by his brother who reported that the patient had been exhibiting bizarre behaviors. The ED notes indicate that brother reported that [name] was not sleeping, had been acting confused waking up his mother during her sleep expressing worry about her health. The patient is a very poor historian today upon assessment so most information is gathered from the chart. He is observed wandering around the unit looking lost. Patient is able to answer basic orientation questions but is very slow to respond. The patient answers "I don't know" to almost all questions posed by Clinical research associate. He does admit to running out of his medications and smoking marijuana daily.   While a patient in this hospital, [name] was enrolled in group counseling and activities as well as received the following medication Current facility-administered medications:acetaminophen (TYLENOL)  tablet 650 mg, 650 mg, Oral, Q6H PRN, Keith Kaufmann, NP;  alum & mag hydroxide-simeth (MAALOX/MYLANTA) 200-200-20 MG/5ML suspension 30 mL, 30 mL, Oral, Q4H PRN, Keith Kaufmann, NP, 30 mL at 10/24/12 2137;  cloNIDine (CATAPRES) tablet 0.3 mg, 0.3 mg, Oral, Daily, Keith Kaufmann, NP, 0.3 mg at 10/27/12 0807 divalproex (DEPAKOTE) DR tablet 500 mg, 500 mg, Oral, BID PC, [name], 500 mg at 10/27/12 0807;  magnesium hydroxide (MILK OF MAGNESIA) suspension 30 mL, 30 mL, Oral, Daily PRN, Keith Kaufmann, NP;  naphazoline (NAPHCON) 0.1 % ophthalmic solution 1 drop, 1 drop, Both Eyes, QID PRN, Keith Kaufmann, NP, 1 drop at 10/24/12 4132;  OLANZapine zydis (ZYPREXA) disintegrating tablet 10 mg, 10 mg, Oral, Q8H PRN, [name], 10 mg at 10/23/12 1314 OLANZapine zydis (ZYPREXA) disintegrating tablet 15 mg, 15 mg, Oral, QHS, Keith Kaufmann, NP, 15 mg at 10/26/12 2115;  traZODone (DESYREL) tablet 100 mg, 100 mg, Oral, QHS,MR X 1, Keith Means, NP, 100 mg at 10/26/12 2115 The patient was very disoriented on admission but began to clear after a few days of being back on his prescription medications. He was started on Depakote DR 500 mg BID for improved mood stability and his Zyprexa Zydis 15 mg hs for psychosis was restarted. His home medication for blood pressure control Clonidine 0.3 mg daily was restarted. Patient after improving was able to talk openly about how smoking marijuana was having a negative impact on his mental health. Patient agreed to remain compliant with prescription medications and to cease abusing substances. Patient attended treatment team meeting this am and met with treatment team  members. Pt symptoms, treatment plan and response to treatment discussed. [name] endorsed that their symptoms have improved. Pt also stated that they are stable for discharge.  In other to control Principal Problem:   Bipolar I disorder, most recent episode (or current) depressed, severe, with mention of psychotic  behavior Active Problems:   Bipolar I disorder, most recent episode depressed   Cannabis abuse , they will continue psychiatric care on outpatient basis. They will follow-up at .  In addition they were instructed to take all your medications as prescribed by your mental healthcare provider, to report any adverse effects and or reactions from your medicines to your outpatient provider promptly, patient is instructed and cautioned to not engage in alcohol and or illegal drug use while on prescription medicines, in the event of worsening symptoms, patient is instructed to call the crisis hotline, 911 and or go to the nearest ED for appropriate evaluation and treatment of symptoms.   Upon discharge, patient adamantly denies suicidal, homicidal ideations, auditory, visual hallucinations and or delusional thinking. They left [name] with all personal belongings in no apparent distress.  Consults:  See electronic record for details  Significant Diagnostic Studies:  See electronic record for details  Discharge Vitals:   Blood pressure 160/95, pulse 71, temperature 98 F (36.7 C), temperature source Oral, resp. rate 20, height 5\' 7"  (1.702 m), weight 83.008 kg (183 lb)..  Mental Status Exam: See Mental Status Examination and Suicide Risk Assessment completed by Attending Physician prior to discharge.  Discharge destination:  Home  Is patient on multiple antipsychotic therapies at discharge:  No  Has Patient had three or more failed trials of antipsychotic monotherapy by history: N/A Recommended Plan for Multiple Antipsychotic Therapies: N/A    Medication List    STOP taking these medications       busPIRone 10 MG tablet  Commonly known as:  BUSPAR     lamoTRIgine 25 MG tablet  Commonly known as:  LAMICTAL     LORazepam 1 MG tablet  Commonly known as:  ATIVAN     OLANZapine 10 MG tablet  Commonly known as:  ZYPREXA  Replaced by:  olanzapine zydis 15 MG disintegrating tablet      TAKE  these medications     Indication   cloNIDine 0.3 MG tablet  Commonly known as:  CATAPRES  Take one tablet each day for hypertension.   Indication:  High Blood Pressure     divalproex 500 MG DR tablet  Commonly known as:  DEPAKOTE  Take 1 tablet (500 mg total) by mouth 2 (two) times daily after a meal. For improved stability of mood.   Indication:  Manic Phase of Manic-Depression     olanzapine zydis 15 MG disintegrating tablet  Commonly known as:  ZYPREXA  Take 1 tablet (15 mg total) by mouth at bedtime.   Indication:  Manic-Depression, Trouble Sleeping, Schizophrenia     traZODone 100 MG tablet  Commonly known as:  DESYREL  Take 1 tablet (100 mg total) by mouth at bedtime and may repeat dose one time if needed.   Indication:  Trouble Sleeping        Follow-up recommendations:   Activities: Resume typical activities Diet: Resume typical diet Tests: none Other: Follow up with outpatient provider and report any side effects to out patient prescriber.  Comments:  Take all your medications as prescribed by your mental healthcare provider. Report any adverse effects and or reactions from your medicines to your outpatient  provider promptly. Patient is instructed and cautioned to not engage in alcohol and or illegal drug use while on prescription medicines. In the event of worsening symptoms, patient is instructed to call the crisis hotline, 911 and or go to the nearest ED for appropriate evaluation and treatment of symptoms. Follow-up with your primary care provider for your other medical issues, concerns and or health care needs.  SignedFransisca Kaufmann NP-C 10/27/2012 9:22 AM

## 2012-10-27 NOTE — [facility] Suicide Risk Assessment (Signed)
Suicide Risk Assessment  Discharge Assessment     Demographic Factors:  Male, Low socioeconomic status and Unemployed  Mental Status Per Nursing Assessment::   On Admission:  Suicidal ideation indicated by patient  Current Mental Status by Physician: patient denies suicidal ideation, intent or plan  Loss Factors: Financial problems/change in socioeconomic status  Historical Factors: Family history of mental illness or substance abuse and Impulsivity  Risk Reduction Factors:   Sense of responsibility to family, Living with another person, especially a relative and Positive social support  Continued Clinical Symptoms:  Alcohol/Substance Abuse/Dependencies  Cognitive Features That Contribute To Risk:  Closed-mindedness Polarized thinking    Suicide Risk:  Minimal: No identifiable suicidal ideation.  Patients presenting with no risk factors but with morbid ruminations; may be classified as minimal risk based on the severity of the depressive symptoms  Discharge Diagnoses:   AXIS I:  Bipolar I disorder, most recent episode (or current) depressed, severe, without mention of psychotic behavior              Cannabis use disorder AXIS II:  Deferred AXIS III:   Past Medical History  Diagnosis Date  . Hypertension    AXIS IV:  other psychosocial or environmental problems and problems related to social environment AXIS V:  61-70 mild symptoms  Plan Of Care/Follow-up recommendations:  Activity:  as tolerated Diet:  healthy Tests:  Valproic acid level: 104.2 Other:  patient to keep his after care appointment  Is patient on multiple antipsychotic therapies at discharge:  No   Has Patient had three or more failed trials of antipsychotic monotherapy by history:  No  Recommended Plan for Multiple Antipsychotic Therapies: N/A  Mack Alvidrez,MD 10/27/2012, 9:35 AM

## 2012-10-27 NOTE — [facility] Suicide Risk Assessment (Signed)
[facility] INPATIENT:  Family/Significant Other Suicide Prevention Education  Suicide Prevention Education:  Education Completed; No one has been identified by the patient as the family member/significant other with whom the patient will be residing, and identified as the person(s) who will aid the patient in the event of a mental health crisis (suicidal ideations/suicide attempt).  With written consent from the patient, the family member/significant other has been provided the following suicide prevention education, prior to the and/or following the discharge of the patient.  The suicide prevention education provided includes the following:  Suicide risk factors  Suicide prevention and interventions  National Suicide Hotline telephone number  [facility] assessment telephone number  [facility] Emergency Assistance 911  [facility] and/or Residential Mobile Crisis Unit telephone number  Request made of family/significant other to:  Remove weapons (e.g., guns, rifles, knives), all items previously/currently identified as safety concern.    Remove drugs/medications (over-the-counter, prescriptions, illicit drugs), all items previously/currently identified as a safety concern.  The family member/significant other verbalizes understanding of the suicide prevention education information provided.  The family member/significant other agrees to remove the items of safety concern listed above. The patient did not endorse SI at the time of admission, nor did the patient c/o SI during the stay here.  SPE not required.    Daryel Gerald B 10/27/2012, 12:51 PM

## 2012-10-27 NOTE — Progress Notes (Signed)
The focus of this group is to help patients review their daily goal of treatment and discuss progress on daily workbooks. Pt attended the evening's wrap-up group and responded to all discussion prompts from the Writer. Pt shared that today was a good day on the unit and that he felt ready to discharge. Pt also remarked that he was feeling considerably better than when he arrived. Pt reported having no additional needs from Nursing Staff this evening and the Writer observed him volunteering positive comments to his peers. Pt's affect was appropriate.

## 2012-10-27 NOTE — Progress Notes (Signed)
Patient ID: [name], male   DOB: 1980/01/29, 32 y.o.   MRN: [id number] Patient discharged per MD order.  Patient will follow up with [facility] for his medication management.  Patient was given all his personal belongings, prescriptions and medication samples.  He denies any SI/HI/AVH.  Patient was given a survey to be completed.  Patient left ambulatory with his mother and sister.

## 2012-10-27 NOTE — Tx Team (Signed)
  Interdisciplinary Treatment Plan Update   Date Reviewed:  10/27/2012  Time Reviewed:  12:43 PM  Progress in Treatment:   Attending groups: Yes Participating in groups: Yes Taking medication as prescribed: Yes  Tolerating medication: Yes Family/Significant other contact made: Yes  Patient understands diagnosis: Yes  Discussing patient identified problems/goals with staff: Yes Medical problems stabilized or resolved: Yes Denies suicidal/homicidal ideation: Yes Patient has not harmed self or others: Yes  For review of initial/current patient goals, please see plan of care.  Estimated Length of Stay:  D/C today  Reason for Continuation of Hospitalization:   New Problems/Goals identified:  N/A  Discharge Plan or Barriers:   Return home, follow up outpt  Additional Comments:  Attendees:  Signature: Thedore Mins, MD 10/27/2012 12:43 PM   Signature: Richelle Ito, LCSW 10/27/2012 12:43 PM  Signature: Fransisca Kaufmann, NP 10/27/2012 12:43 PM  Signature: Joslyn Devon, RN 10/27/2012 12:43 PM  Signature: Liborio Nixon, RN 10/27/2012 12:43 PM  Signature:  10/27/2012 12:43 PM  Signature:   10/27/2012 12:43 PM  Signature:    Signature:    Signature:    Signature:    Signature:    Signature:      Scribe for Treatment Team:   Richelle Ito, LCSW  10/27/2012 12:43 PM

## 2012-10-30 NOTE — Discharge Summary (Signed)
Seen and agreed. Kathyleen Radice, MD 

## 2012-10-30 NOTE — Progress Notes (Signed)
Patient Discharge Instructions:  After Visit Summary (AVS):   Faxed to:  10/30/12 Discharge Summary Note:   Faxed to:  10/30/12 Psychiatric Admission Assessment Note:   Faxed to:  10/30/12 Suicide Risk Assessment - Discharge Assessment:   Faxed to:  10/30/12 Faxed/Sent to the Next Level Care provider:  10/30/12 Faxed to [facility] @ [phone]  Jerelene Redden, 10/30/2012, 3:33 PM

## 2013-10-18 IMAGING — CT CT HEAD W/O CM
2 of 3 series · 16 of 30 positions shown, 20 images · non-contrast
Comparison: 01/06/2010

CLINICAL DATA: Altered mental status.

CT HEAD WITHOUT CONTRAST
TECHNIQUE: Contiguous axial images were obtained from the base of
the skull through the vertex without contrast.

[Series 4: headseq 4.8 h37s · axial · 0.43mm/px · z∈[+1254,+1372]mm · 13 of 30 slices shown, 17 images (1 of 2)]
[im 3/30  brain]
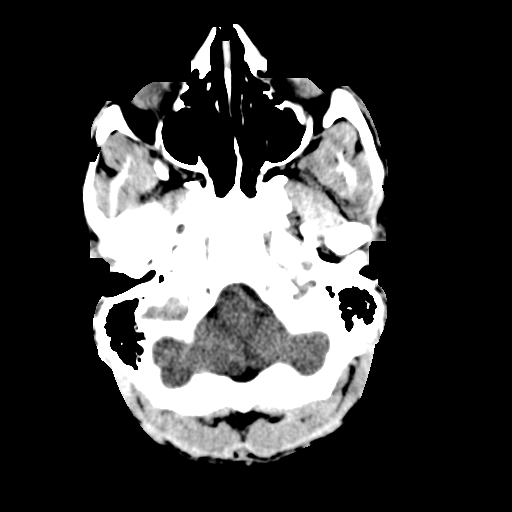
[im 3/30  bone]
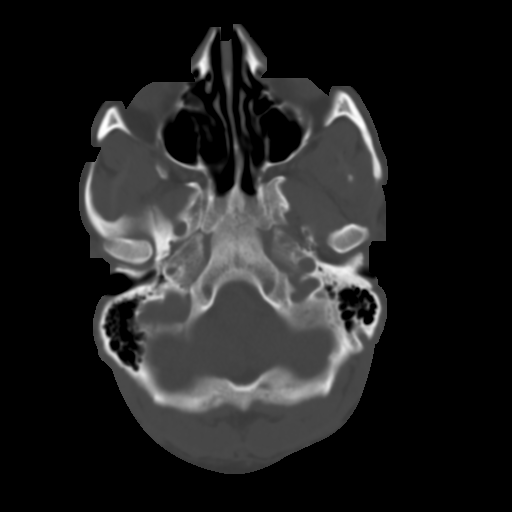
[im 5/30  brain]
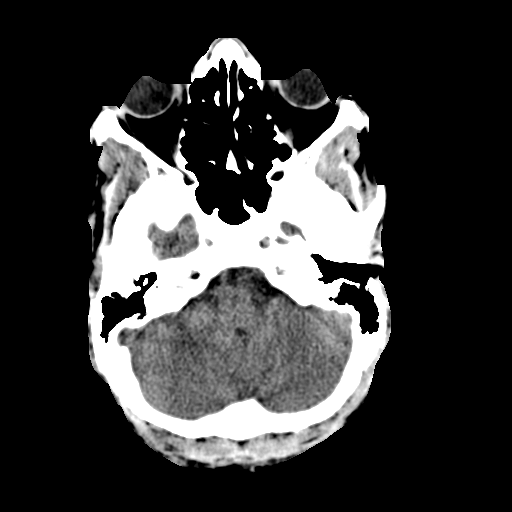
[im 7/30  brain]
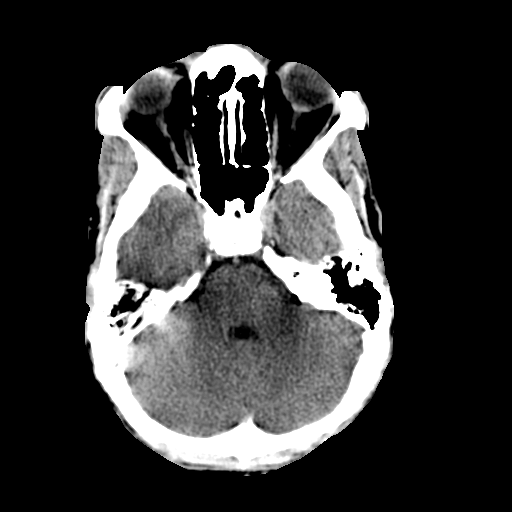
[im 9/30  brain]
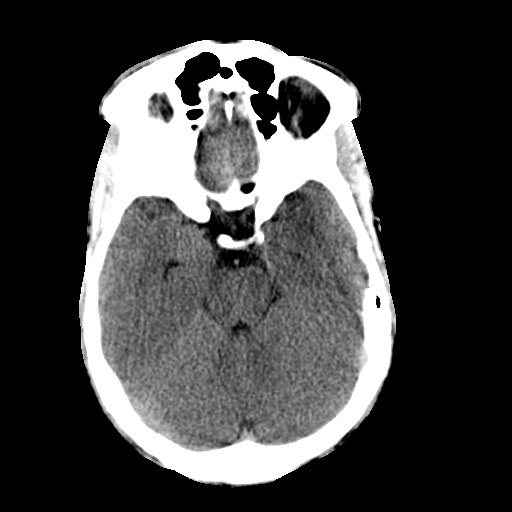
[im 11/30  brain]
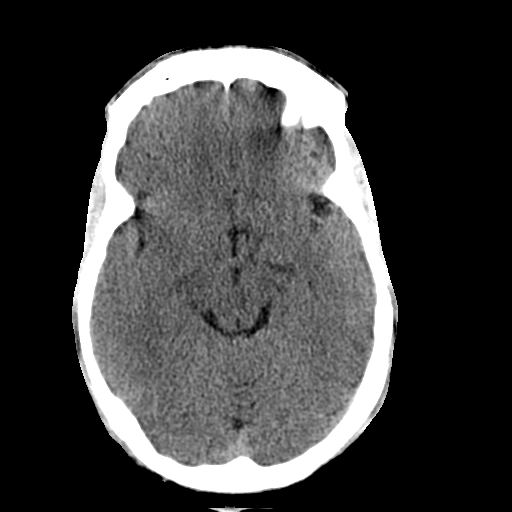
[im 11/30  bone]
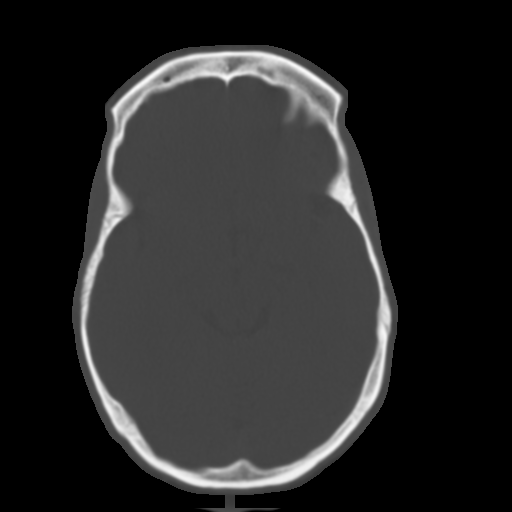
[im 13/30  brain]
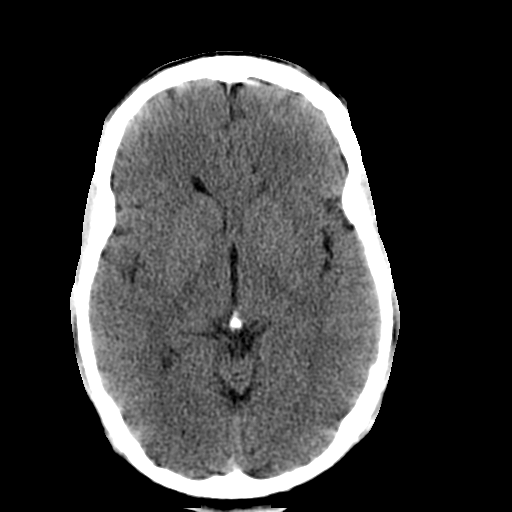
[im 15/30  brain]
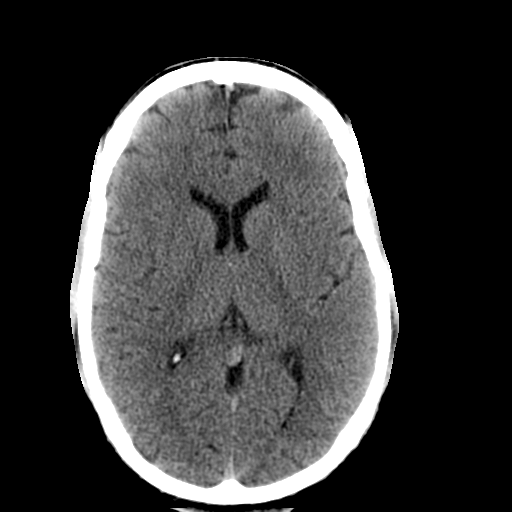
[im 17/30  brain]
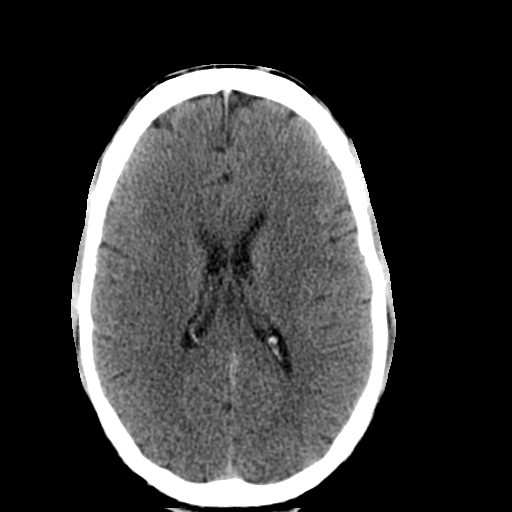
[im 19/30  brain]
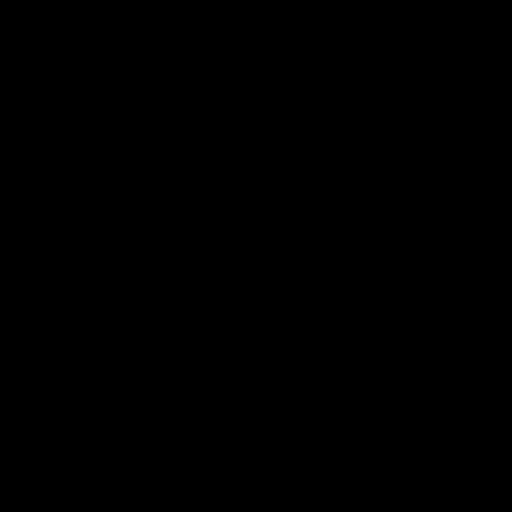
[im 19/30  bone]
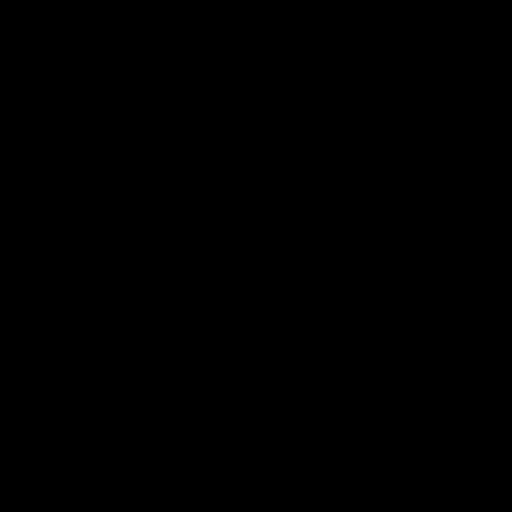
[im 21/30  brain]
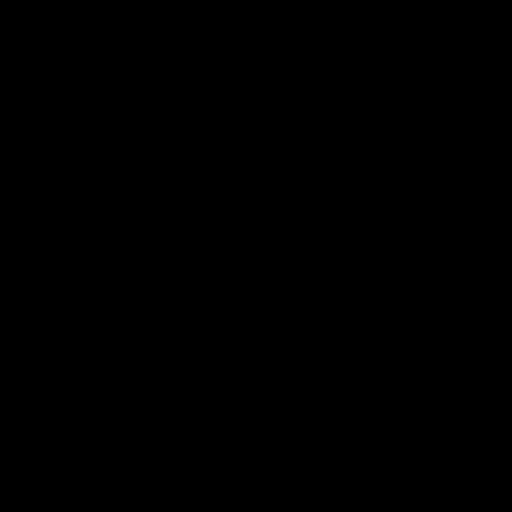
[im 23/30  brain]
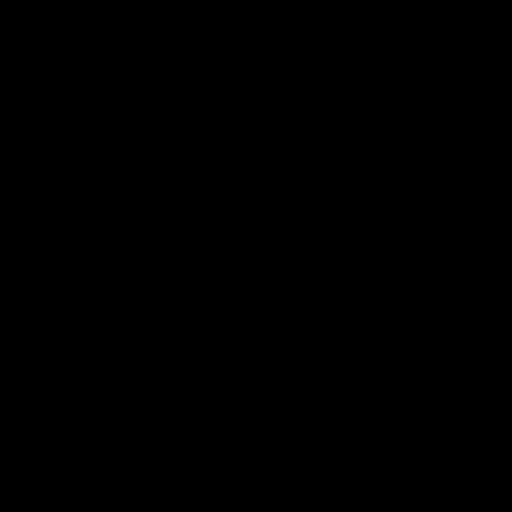
[im 25/30  brain]
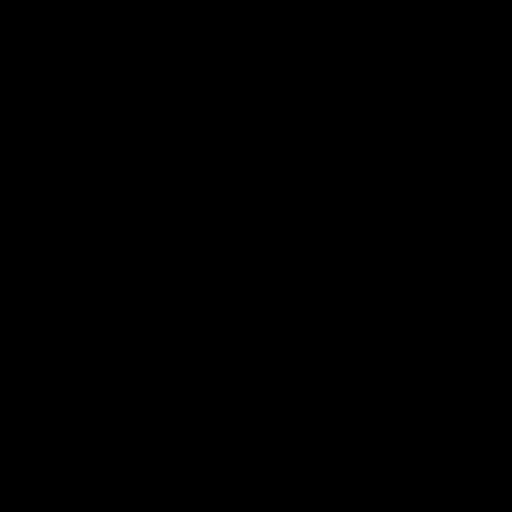
[im 27/30  brain]
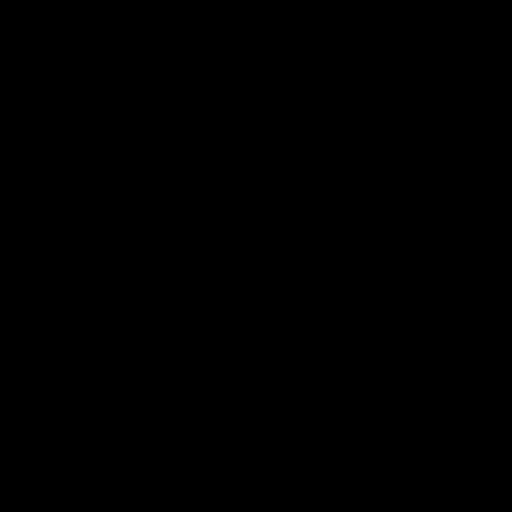
[im 27/30  bone]
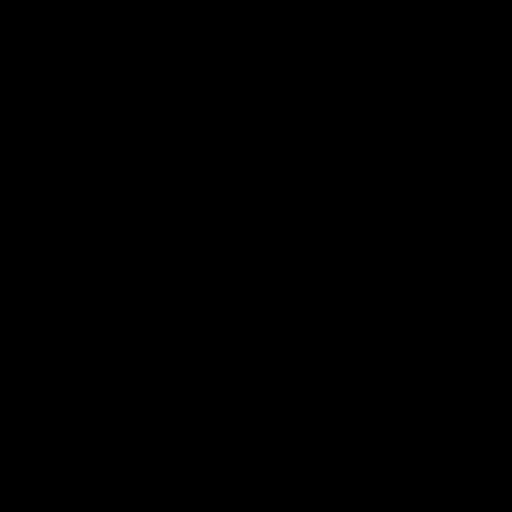

[Series 6: headseq 4.8 h37s · axial · 0.43mm/px · z∈[+1324,+1343]mm · 3 of 18 slices shown (2 of 2)]
[im 3/18  brain]
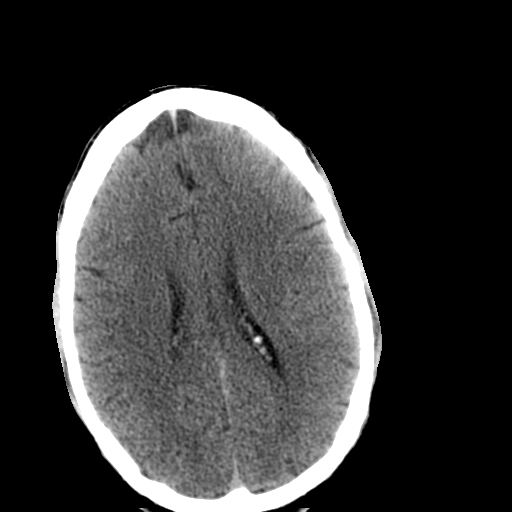
[im 5/18  brain]
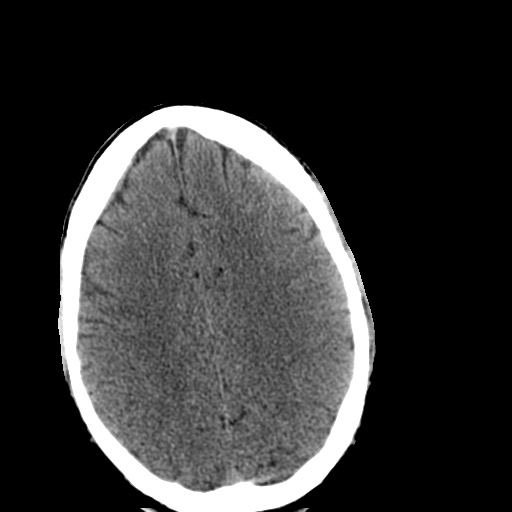
[im 7/18  brain]
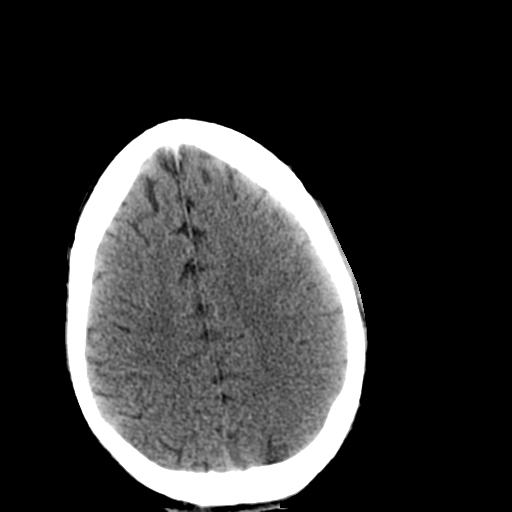

[16 of 30 positions shown; findings below may reference images not displayed]

FINDINGS: There is no acute intracranial hemorrhage, infarction, or
mass lesion.  Brain parenchyma is normal.  Osseous structures are
normal.
IMPRESSION: Normal exam.

## 2018-09-11 ENCOUNTER — Ambulatory Visit
Admission: EM | Admit: 2018-09-11 | Discharge: 2018-09-11 | Disposition: A | Discharge status: Home / Self Care | Payer: BC Managed Care – PPO

## 2018-09-11 ENCOUNTER — Other Ambulatory Visit: Payer: Self-pay

## 2018-09-11 DIAGNOSIS — F319 Bipolar disorder, unspecified: Secondary | ICD-10-CM

## 2018-09-11 DIAGNOSIS — I10 Essential (primary) hypertension: Secondary | ICD-10-CM | POA: Diagnosis not present

## 2018-09-11 DIAGNOSIS — Z760 Encounter for issue of repeat prescription: Secondary | ICD-10-CM

## 2018-09-11 DIAGNOSIS — R030 Elevated blood-pressure reading, without diagnosis of hypertension: Secondary | ICD-10-CM

## 2018-09-11 MED ORDER — LAMOTRIGINE 100 MG PO TABS: 100.0000 mg | ORAL_TABLET | Freq: Every day | ORAL | 1 refills | Status: AC

## 2018-09-11 MED ORDER — OLANZAPINE 20 MG PO TABS: 20.0000 mg | ORAL_TABLET | Freq: Every day | ORAL | 1 refills | Status: AC

## 2018-09-11 NOTE — Discharge Instructions (Addendum)
Rest and drink fluids Eat a well-balanced diet, and avoid excessive caffeine intake Medications refilled Some things you may try doing to help alleviate your symptoms include: keeping a journal, exercise, talking to a friend or relative, listening to music, going for a walk or hike outside, or other activities that you may find enjoyable Follow up with PCP for further evaluation and management of bipolar disorder Return or go to ER if you have any new or worsening symptoms such as fever, chills, fatigue, worsening shortness of breath, wheezing, chest pain, nausea, vomiting, abdominal pain, changes in bowel or bladder habits, etc...   Blood pressure elevated in office.  Please recheck in 24 hours.  If it continues to be greater than 140/90 please follow up with PCP for further evaluation and management.    You may follow up with [facility] located in [place] for primary care and/ or behavioral health.

## 2018-09-11 NOTE — ED Provider Notes (Addendum)
[facility]   [id number] 09/11/18 Arrival Time: 1522  [facility]  SUBJECTIVE:  [name] is a 38 y.o. male who presents for medication refill.  Hx of bipolar disorder type 1 x 17 years.  Takes lamictal 100 mg daily and zyprexa 20 mg daily.  States bipolar disorder is well-controlled on current medications.   Does not have a PCP, and [place].  Denies SI, HI, depression, mood swings, impulsivity, anhedonia, fever, chills, nausea, vomiting, chest pain, SOB.     ROS: As per HPI.  All other pertinent ROS negative.     Past Medical History:  Diagnosis Date  . Bipolar 1 disorder (HCC)   . Hypertension   . Schizophrenia [facility](HCC)    Past Surgical History:  Procedure Laterality Date  . COLONOSCOPY     1999   No Known Allergies No current facility-administered medications on file prior to encounter.    Current Outpatient Medications on File Prior to Encounter  Medication Sig Dispense Refill  . [DISCONTINUED] cloNIDine (CATAPRES) 0.3 MG tablet Take one tablet each day for hypertension. 30 tablet 0  . [DISCONTINUED] divalproex (DEPAKOTE) 500 MG DR tablet Take 1 tablet (500 mg total) by mouth 2 (two) times daily after a meal. For improved stability of mood. 60 tablet 0  . [DISCONTINUED] traZODone (DESYREL) 100 MG tablet Take 1 tablet (100 mg total) by mouth at bedtime and may repeat dose one time if needed. 60 tablet 0   Social History   Socioeconomic History  . Marital status: Single    Spouse name: Not on file  . Number of children: Not on file  . Years of education: Not on file  . Highest education level: Not on file  Occupational History  . Not on file  Social Needs  . Financial resource strain: Not on file  . Food insecurity    Worry: Not on file    Inability: Not on file  . Transportation needs    Medical: Not on file    Non-medical: Not on file  Tobacco Use  . Smoking status: Current Every Day Smoker     Packs/day: 1.00    Years: 10.00    Pack years: 10.00    Types: Cigarettes  Substance and Sexual Activity  . Alcohol use: No  . Drug use: Yes    Types: Marijuana  . Sexual activity: Yes    Birth control/protection: Condom  Lifestyle  . Physical activity    Days per week: Not on file    Minutes per session: Not on file  . Stress: Not on file  Relationships  . Social Musicianconnections    Talks on phone: Not on file    Gets together: Not on file    Attends religious service: Not on file    Active member of club or organization: Not on file    Attends meetings of clubs or organizations: Not on file    Relationship status: Not on file  . Intimate partner violence    Fear of current or ex partner: Not on file    Emotionally abused: Not on file    Physically abused: Not on file    Forced sexual activity: Not on file  Other Topics Concern  . Not on file  Social History Narrative  . Not on file   Family History  Problem Relation Age of Onset  . Diabetes Mother   . Hypertension Mother   . Hypertension  Father     OBJECTIVE:  Vitals:   09/11/18 1536  BP: (!) 170/74  Pulse: 82  Resp: 18  Temp: 98.3 F (36.8 C)  SpO2: 95%    General appearance: alert; no distress Eyes: PERRLA; EOMI HENT: normocephalic; atraumatic; oropharynx clear Neck: supple with FROM Lungs: clear to auscultation bilaterally Heart: regular rate and rhythm.  Radial pulses 2+ symmetrical bilaterally Extremities: no edema; symmetrical with no gross deformities Skin: warm and dry Neurologic: ambulates without difficulty Psychological: alert and cooperative; well-dressed and groomed; pleasant mood and affect   ASSESSMENT & PLAN:  1. Bipolar 1 disorder ([place])   2. Encounter for medication refill   3. Elevated blood pressure reading     Meds ordered this encounter  Medications  . lamoTRIgine (LAMICTAL) 100 MG tablet    Sig: Take 1 tablet (100 mg total) by mouth daily.    Dispense:  30 tablet     Refill:  1    Order Specific Question:   Supervising Provider    Answer:   Raylene Everts [[id number]]  . OLANZapine (ZYPREXA) 20 MG tablet    Sig: Take 1 tablet (20 mg total) by mouth daily.    Dispense:  30 tablet    Refill:  1    Order Specific Question:   Supervising Provider    Answer:   Raylene Everts [[id number]]    Rest and drink fluids Eat a well-balanced diet, and avoid excessive caffeine intake Medications refilled Some things you may try doing to help alleviate your symptoms include: keeping a journal, exercise, talking to a friend or relative, listening to music, going for a walk or hike outside, or other activities that you may find enjoyable Follow up with PCP for further evaluation and management of bipolar disorder Return or go to ER if you have any new or worsening symptoms such as fever, chills, fatigue, worsening shortness of breath, wheezing, chest pain, nausea, vomiting, abdominal pain, changes in bowel or bladder habits, etc...   Blood pressure elevated in office.  Please recheck in 24 hours.  If it continues to be greater than 140/90 please follow up with PCP for further evaluation and management.    Reviewed expectations re: course of current medical issues. Questions answered. Outlined signs and symptoms indicating need for more acute intervention. Patient verbalized understanding. After Visit Summary given.   Lestine Box, PA-C 09/11/18 [place], [place], PA-C 09/11/18 1600

## 2018-09-11 NOTE — ED Triage Notes (Signed)
Needs refills on psychotropic meds until can be seen by [facility]
